# Patient Record
Sex: Female | Born: 1960 | Race: White | Hispanic: No | Marital: Married | Smoking: Never smoker
Health system: Southern US, Community
[De-identification: ages and names within clinical notes are randomized; demographics above are authoritative.]

## PROBLEM LIST (undated history)

## (undated) DIAGNOSIS — M8580 Other specified disorders of bone density and structure, unspecified site: Secondary | ICD-10-CM

## (undated) DIAGNOSIS — F509 Eating disorder, unspecified: Secondary | ICD-10-CM

## (undated) DIAGNOSIS — E785 Hyperlipidemia, unspecified: Secondary | ICD-10-CM

## (undated) DIAGNOSIS — N390 Urinary tract infection, site not specified: Secondary | ICD-10-CM

## (undated) DIAGNOSIS — F909 Attention-deficit hyperactivity disorder, unspecified type: Secondary | ICD-10-CM

## (undated) HISTORY — DX: Attention-deficit hyperactivity disorder, unspecified type: F90.9

## (undated) HISTORY — DX: Other specified disorders of bone density and structure, unspecified site: M85.80

## (undated) HISTORY — DX: Eating disorder, unspecified: F50.9

## (undated) HISTORY — DX: Hyperlipidemia, unspecified: E78.5

## (undated) HISTORY — DX: Urinary tract infection, site not specified: N39.0

---

## 1998-07-28 ENCOUNTER — Other Ambulatory Visit: Admission: RE | Admit: 1998-07-28 | Discharge: 1998-07-28 | Payer: Self-pay | Admitting: Obstetrics & Gynecology

## 2000-01-13 ENCOUNTER — Other Ambulatory Visit: Admission: RE | Admit: 2000-01-13 | Discharge: 2000-01-13 | Payer: Self-pay | Admitting: Obstetrics & Gynecology

## 2001-04-10 ENCOUNTER — Other Ambulatory Visit: Admission: RE | Admit: 2001-04-10 | Discharge: 2001-04-10 | Payer: Self-pay | Admitting: Obstetrics and Gynecology

## 2006-04-07 ENCOUNTER — Encounter: Admission: RE | Admit: 2006-04-07 | Discharge: 2006-04-07 | Payer: Self-pay | Admitting: Obstetrics and Gynecology

## 2012-12-26 ENCOUNTER — Other Ambulatory Visit: Payer: Self-pay

## 2012-12-26 DIAGNOSIS — Z1231 Encounter for screening mammogram for malignant neoplasm of breast: Secondary | ICD-10-CM

## 2013-01-02 ENCOUNTER — Ambulatory Visit
Admission: RE | Admit: 2013-01-02 | Discharge: 2013-01-02 | Disposition: A | Discharge status: Home / Self Care | Payer: BC Managed Care – PPO | Source: Ambulatory Visit

## 2013-01-02 DIAGNOSIS — Z1231 Encounter for screening mammogram for malignant neoplasm of breast: Secondary | ICD-10-CM

## 2016-12-15 DIAGNOSIS — Z131 Encounter for screening for diabetes mellitus: Secondary | ICD-10-CM | POA: Diagnosis not present

## 2016-12-15 DIAGNOSIS — E7800 Pure hypercholesterolemia, unspecified: Secondary | ICD-10-CM | POA: Diagnosis not present

## 2016-12-15 DIAGNOSIS — Z23 Encounter for immunization: Secondary | ICD-10-CM | POA: Diagnosis not present

## 2017-02-07 DIAGNOSIS — Z1231 Encounter for screening mammogram for malignant neoplasm of breast: Secondary | ICD-10-CM | POA: Diagnosis not present

## 2017-02-07 DIAGNOSIS — Z6822 Body mass index (BMI) 22.0-22.9, adult: Secondary | ICD-10-CM | POA: Diagnosis not present

## 2017-02-07 DIAGNOSIS — Z01419 Encounter for gynecological examination (general) (routine) without abnormal findings: Secondary | ICD-10-CM | POA: Diagnosis not present

## 2018-01-05 DIAGNOSIS — Z23 Encounter for immunization: Secondary | ICD-10-CM | POA: Diagnosis not present

## 2018-01-05 DIAGNOSIS — N390 Urinary tract infection, site not specified: Secondary | ICD-10-CM | POA: Diagnosis not present

## 2018-01-05 DIAGNOSIS — E7800 Pure hypercholesterolemia, unspecified: Secondary | ICD-10-CM | POA: Diagnosis not present

## 2018-02-08 DIAGNOSIS — Z6822 Body mass index (BMI) 22.0-22.9, adult: Secondary | ICD-10-CM | POA: Diagnosis not present

## 2018-02-08 DIAGNOSIS — R399 Unspecified symptoms and signs involving the genitourinary system: Secondary | ICD-10-CM | POA: Diagnosis not present

## 2018-02-08 DIAGNOSIS — Z124 Encounter for screening for malignant neoplasm of cervix: Secondary | ICD-10-CM | POA: Diagnosis not present

## 2018-02-08 DIAGNOSIS — Z01419 Encounter for gynecological examination (general) (routine) without abnormal findings: Secondary | ICD-10-CM | POA: Diagnosis not present

## 2018-03-21 ENCOUNTER — Ambulatory Visit (HOSPITAL_COMMUNITY)
Admission: EM | Admit: 2018-03-21 | Discharge: 2018-03-21 | Disposition: A | Discharge status: Home / Self Care | Payer: 59 | Attending: Emergency Medicine | Admitting: Emergency Medicine

## 2018-03-21 ENCOUNTER — Encounter (HOSPITAL_COMMUNITY): Payer: Self-pay

## 2018-03-21 DIAGNOSIS — R059 Cough, unspecified: Secondary | ICD-10-CM

## 2018-03-21 DIAGNOSIS — R05 Cough: Secondary | ICD-10-CM | POA: Diagnosis not present

## 2018-03-21 DIAGNOSIS — J22 Unspecified acute lower respiratory infection: Secondary | ICD-10-CM | POA: Diagnosis not present

## 2018-03-21 MED ORDER — AZITHROMYCIN 250 MG PO TABS
250.0000 mg | ORAL_TABLET | Freq: Every day | ORAL | 0 refills | Status: DC
Start: 2018-03-21 — End: 2020-02-11

## 2018-03-21 MED ORDER — HYDROCODONE-HOMATROPINE 5-1.5 MG/5ML PO SYRP: 5.0000 mL | ORAL_SOLUTION | Freq: Four times a day (QID) | ORAL | 0 refills | Status: DC | PRN

## 2018-03-21 MED ORDER — BENZONATATE 200 MG PO CAPS: 200.0000 mg | ORAL_CAPSULE | Freq: Three times a day (TID) | ORAL | 0 refills | Status: DC

## 2018-03-21 NOTE — ED Triage Notes (Signed)
Pt presents a cough that she has had for over a week.

## 2018-03-21 NOTE — Discharge Instructions (Signed)
Please begin taking azithromycin-2 tablets today, 1 tablet for the following 4 days to cover any  atypical respiratory infection May use Tessalon every 8 hours as needed during the day or at work May use Hycodan cough syrup at bedtime or when resting at home.  This will cause drowsiness, do not drive or work after taking. Please consider also starting a daily allergy pill to help with any congestion and drainage contributing to cough-may get generic of Zyrtec or Claritin over-the-counter  Please follow-up if symptoms continuing to not improve, worsening, developing fever, chest discomfort, shortness of breath

## 2018-03-21 NOTE — ED Provider Notes (Signed)
[facility]    CSN: [id number] Arrival date & time: 03/21/18  1744     History   Chief Complaint Chief Complaint  Patient presents with  . Cough    HPI Lynsie C [place] is a 57 y.o. female no significant past medical history presenting today for evaluation of a cough.  Patient has had a cough for over 1 week.  She initially had sore throat and congestion, but this is relatively improved.  Cough is been occasionally productive.  Overall she does not feel too bad, but the cough is been interrupting work as well as sleep.  Denies history of asthma or smoking.  She has tried over-the-counter cough medicine without relief.  Denies fevers.  Denies associated nausea, vomiting or diarrhea.  HPI  History reviewed. No pertinent past medical history.  There are no active problems to display for this patient.   History reviewed. No pertinent surgical history.  OB History   None      Home Medications    Prior to Admission medications   Medication Sig Start Date End Date Taking? Authorizing Provider  azithromycin (ZITHROMAX) 250 MG tablet Take 1 tablet (250 mg total) by mouth daily. Take first 2 tablets together, then 1 every day until finished. 03/21/18   Patirica Longshore C, PA-C  benzonatate (TESSALON) 200 MG capsule Take 1 capsule (200 mg total) by mouth every 8 (eight) hours. Use during day/work 03/21/18   Nakiah Osgood C, PA-C  HYDROcodone-homatropine (HYCODAN) 5-1.5 MG/5ML syrup Take 5 mLs by mouth every 6 (six) hours as needed for cough. Limit use to at home or bedtime. Do not drive or work after taking. 03/21/18   Treyvonne Tata, Junius Creamer, PA-C    Family History Family History  Family history unknown: Yes    Social History Social History   Tobacco Use  . Smoking status: Never Smoker  . Smokeless tobacco: Never Used  Substance Use Topics  . Alcohol use: Never    Frequency: Never  . Drug use: Never     Allergies   Patient has no allergy information on  record.   Review of Systems Review of Systems  Constitutional: Negative for activity change, appetite change, chills, fatigue and fever.  HENT: Positive for congestion and sore throat. Negative for ear pain, rhinorrhea, sinus pressure and trouble swallowing.   Eyes: Negative for discharge and redness.  Respiratory: Positive for cough. Negative for chest tightness and shortness of breath.   Cardiovascular: Negative for chest pain.  Gastrointestinal: Negative for abdominal pain, diarrhea, nausea and vomiting.  Musculoskeletal: Negative for myalgias.  Skin: Negative for rash.  Neurological: Negative for dizziness, light-headedness and headaches.     Physical Exam Triage Vital Signs ED Triage Vitals  Enc Vitals Group     BP 03/21/18 1818 113/69     Pulse Rate 03/21/18 1818 91     Resp 03/21/18 1818 16     Temp 03/21/18 1818 98.3 F (36.8 C)     Temp Source 03/21/18 1818 Oral     SpO2 03/21/18 1818 100 %     Weight --      Height --      Head Circumference --      Peak Flow --      Pain Score 03/21/18 1817 0     Pain Loc --      Pain Edu? --      Excl. in GC? --    No data found.  Updated Vital Signs BP 113/69 (  BP Location: Right Arm)   Pulse 91   Temp 98.3 F (36.8 C) (Oral)   Resp 16   SpO2 100%   Visual Acuity Right Eye Distance:   Left Eye Distance:   Bilateral Distance:    Right Eye Near:   Left Eye Near:    Bilateral Near:     Physical Exam  Constitutional: She appears well-developed and well-nourished. No distress.  HENT:  Head: Normocephalic and atraumatic.  Bilateral ears without tenderness to palpation of external auricle, tragus and mastoid, EAC's without erythema or swelling, TM's with good bony landmarks and cone of light. Non erythematous.  Oral mucosa pink and moist, no tonsillar enlargement or exudate. Posterior pharynx patent and nonerythematous, no uvula deviation or swelling. Normal phonation.  Eyes: Conjunctivae are normal.  Neck: Neck  supple.  Cardiovascular: Normal rate and regular rhythm.  No murmur heard. Pulmonary/Chest: Effort normal and breath sounds normal. No respiratory distress.  Breathing comfortably at rest, CTABL, no wheezing, rales or other adventitious sounds auscultated  Abdominal: Soft. There is no tenderness.  Musculoskeletal: She exhibits no edema.  Neurological: She is alert.  Skin: Skin is warm and dry.  Psychiatric: She has a normal mood and affect.  Nursing note and vitals reviewed.    UC Treatments / Results  Labs (all labs ordered are listed, but only abnormal results are displayed) Labs Reviewed - No data to display  EKG None  Radiology No results found.  Procedures Procedures (including critical care time)  Medications Ordered in UC Medications - No data to display  Initial Impression / Assessment and Plan / UC Course  I have reviewed the triage vital signs and the nursing notes.  Pertinent labs & imaging results that were available during my care of the patient were reviewed by me and considered in my medical decision making (see chart for details).     Patient with URI symptoms for greater than 1 week.  Will provide azithromycin to cover atypical respiratory illnesses does not seem largely in chest.  Will provide Tessalon for cough during the day, Hycodan to use at home at bedtime.  Also recommended daily allergy pill to help with any drainage that could be triggering cough.  Continue to monitor symptoms, breathing and temperature,Discussed strict return precautions. Patient verbalized understanding and is agreeable with plan.  Final Clinical Impressions(s) / UC Diagnoses   Final diagnoses:  Cough  Lower respiratory infection (e.g., bronchitis, pneumonia, pneumonitis, pulmonitis)     Discharge Instructions     Please begin taking azithromycin-2 tablets today, 1 tablet for the following 4 days to cover any  atypical respiratory infection May use Tessalon every 8 hours  as needed during the day or at work May use Hycodan cough syrup at bedtime or when resting at home.  This will cause drowsiness, do not drive or work after taking. Please consider also starting a daily allergy pill to help with any congestion and drainage contributing to cough-may get generic of Zyrtec or Claritin over-the-counter  Please follow-up if symptoms continuing to not improve, worsening, developing fever, chest discomfort, shortness of breath   ED Prescriptions    Medication Sig Dispense Auth. Provider   azithromycin (ZITHROMAX) 250 MG tablet Take 1 tablet (250 mg total) by mouth daily. Take first 2 tablets together, then 1 every day until finished. 6 tablet Reighlynn Swiney C, PA-C   HYDROcodone-homatropine (HYCODAN) 5-1.5 MG/5ML syrup Take 5 mLs by mouth every 6 (six) hours as needed for cough. Limit use to  at home or bedtime. Do not drive or work after taking. 75 mL Qasim Diveley C, PA-C   benzonatate (TESSALON) 200 MG capsule Take 1 capsule (200 mg total) by mouth every 8 (eight) hours. Use during day/work 20 capsule Ahmira Boisselle, Harts C, PA-C     Controlled Substance Prescriptions [place] Controlled Substance Registry consulted? Not Applicable   Lew Dawes, [place] 03/21/18 1953

## 2018-03-28 ENCOUNTER — Telehealth: Payer: Self-pay | Admitting: Physician Assistant

## 2018-03-28 NOTE — Telephone Encounter (Signed)
Patient requesting Adderall prescription per conversation with you. Please send to [facility] on [address]. Chart in your box.

## 2018-03-30 ENCOUNTER — Other Ambulatory Visit: Payer: Self-pay | Admitting: Physician Assistant

## 2018-03-30 MED ORDER — AMPHETAMINE-DEXTROAMPHETAMINE 7.5 MG PO TABS: 7.5000 mg | ORAL_TABLET | Freq: Every day | ORAL | 0 refills | Status: DC

## 2018-05-03 ENCOUNTER — Encounter: Payer: Self-pay | Admitting: Emergency Medicine

## 2018-05-03 DIAGNOSIS — F988 Other specified behavioral and emotional disorders with onset usually occurring in childhood and adolescence: Secondary | ICD-10-CM

## 2018-06-23 ENCOUNTER — Ambulatory Visit: Payer: 59 | Admitting: Physician Assistant

## 2018-06-23 ENCOUNTER — Encounter: Payer: Self-pay | Admitting: Physician Assistant

## 2018-06-23 ENCOUNTER — Other Ambulatory Visit: Payer: Self-pay

## 2018-06-23 DIAGNOSIS — F900 Attention-deficit hyperactivity disorder, predominantly inattentive type: Secondary | ICD-10-CM | POA: Diagnosis not present

## 2018-06-23 DIAGNOSIS — F4010 Social phobia, unspecified: Secondary | ICD-10-CM | POA: Diagnosis not present

## 2018-06-23 MED ORDER — AMPHETAMINE-DEXTROAMPHETAMINE 7.5 MG PO TABS: 7.5000 mg | ORAL_TABLET | Freq: Every day | ORAL | 0 refills | Status: DC

## 2018-06-23 NOTE — Progress Notes (Signed)
[facility]  Patient ID: Misty Osborne,  MRN: [id number]  PCP: Shaune Pollack, MD (Inactive)  Date of Evaluation: 06/23/2018 Time spent:15 minutes  Chief Complaint:  Chief Complaint    Follow-up      HISTORY/CURRENT STATUS: HPI here for routine six-month med check.  States she is doing well.  The Adderall works the vast majority of the time.  There can be days that she does not feel like she is taking it at all but she is not sure if it is her work load is overwhelming or what. States that attention is good  Able to focus on things and finish tasks to completion.  She does get a little nervous just because this is a controlled substance.  She feels like when she goes to the pharmacy that she is being judged for taking it.  "I think it is just all in my head."  She continues to take the propranolol before a big presentation or something at work.  It still works for that anxiety but it does cause fatigue sometimes.  Denies muscle or joint pain, stiffness, or dystonia.  Denies dizziness, syncope, seizures, numbness, tingling, tremor, tics, unsteady gait, slurred speech, confusion.   Individual Medical History/ Review of Systems: Changes? :No    Past medications for mental health diagnoses include: Prozac, Adderall, propranolol  Allergies: Patient has no known allergies.  Current Medications:  Current Outpatient Medications:  .  [START ON 07/04/2018] amphetamine-dextroamphetamine (ADDERALL) 7.5 MG tablet, Take 1 tablet by mouth daily., Disp: 30 tablet, Rfl: 0 .  propranolol (INDERAL) 20 MG tablet, Take 20 mg by mouth daily as needed., Disp: , Rfl: 5 .  [START ON 08/03/2018] amphetamine-dextroamphetamine (ADDERALL) 7.5 MG tablet, Take 1 tablet by mouth daily., Disp: 30 tablet, Rfl: 0 .  [START ON 09/01/2018] amphetamine-dextroamphetamine (ADDERALL) 7.5 MG tablet, Take 1 tablet by mouth daily., Disp: 30 tablet, Rfl: 0 .  azithromycin (ZITHROMAX) 250 MG tablet, Take 1 tablet  (250 mg total) by mouth daily. Take first 2 tablets together, then 1 every day until finished. (Patient not taking: Reported on 06/23/2018), Disp: 6 tablet, Rfl: 0 .  benzonatate (TESSALON) 200 MG capsule, Take 1 capsule (200 mg total) by mouth every 8 (eight) hours. Use during day/work (Patient not taking: Reported on 06/23/2018), Disp: 20 capsule, Rfl: 0 .  HYDROcodone-homatropine (HYCODAN) 5-1.5 MG/5ML syrup, Take 5 mLs by mouth every 6 (six) hours as needed for cough. Limit use to at home or bedtime. Do not drive or work after taking. (Patient not taking: Reported on 06/23/2018), Disp: 75 mL, Rfl: 0 Medication Side Effects: none  Family Medical/ Social History: Changes? No  MENTAL HEALTH EXAM:  There were no vitals taken for this visit.There is no height or weight on file to calculate BMI.  General Appearance: Casual and Well Groomed  Eye Contact:  Good  Speech:  Clear and Coherent  Volume:  Normal  Mood:  Euthymic  Affect:  Appropriate  Thought Process:  Goal Directed  Orientation:  Full (Time, Place, and Person)  Thought Content: Logical   Suicidal Thoughts:  No  Homicidal Thoughts:  No  Memory:  WNL  Judgement:  Good  Insight:  Good  Psychomotor Activity:  Normal  Concentration:  Concentration: Good and Attention Span: Good  Recall:  Good  Fund of Knowledge: Good  Language: Good  Assets:  Desire for Improvement  ADL's:  Intact  Cognition: WNL  Prognosis:  Good    DIAGNOSES:    ICD-10-CM  1. Attention deficit hyperactivity disorder (ADHD), predominantly inattentive type F90.0   2. Social anxiety disorder F40.10     Receiving Psychotherapy: No    RECOMMENDATIONS: We discussed the fact that the Adderall is working so I prefer not to make any changes.  She is on a low dose and has responded to that same dose for almost 2 years now.  If we did change to a non-stimulant, I would recommend Strattera.  We discussed briefly.  She will think about it but at this point would  like to "not rock the boat." Continue Adderall 7.5 mg p.o. every morning.  PDMP was reviewed. Continue propranolol as needed public speaking.  Her PCP gives her that. Return in 6 months or sooner as needed.  [name], PA-C   This record has been created using [employer].  Chart creation errors have been sought, but may not always have been located and corrected. Such creation errors do not reflect on the standard of medical care.

## 2018-09-07 ENCOUNTER — Other Ambulatory Visit: Payer: Self-pay

## 2018-09-07 ENCOUNTER — Ambulatory Visit (HOSPITAL_COMMUNITY)
Admission: EM | Admit: 2018-09-07 | Discharge: 2018-09-07 | Disposition: A | Discharge status: Home / Self Care | Payer: No Typology Code available for payment source | Attending: Family Medicine | Admitting: Family Medicine

## 2018-09-07 ENCOUNTER — Encounter (HOSPITAL_COMMUNITY): Payer: Self-pay

## 2018-09-07 DIAGNOSIS — N3090 Cystitis, unspecified without hematuria: Secondary | ICD-10-CM

## 2018-09-07 MED ORDER — CEPHALEXIN 500 MG PO CAPS: 500.0000 mg | ORAL_CAPSULE | Freq: Two times a day (BID) | ORAL | 0 refills | Status: DC

## 2018-09-07 NOTE — ED Triage Notes (Signed)
Pt states she has had a UTI for 3 days.

## 2018-09-07 NOTE — ED Provider Notes (Signed)
[facility]    ASSESSMENT & PLAN:  1. Cystitis     Meds ordered this encounter  Medications  . cephALEXin (KEFLEX) 500 MG capsule    Sig: Take 1 capsule (500 mg total) by mouth 2 (two) times daily.    Dispense:  10 capsule    Refill:  0   Visual U/A interpretation with moderate leukocytes (U/A machine down). Urine culture sent. No signs of pyelonephritis. Will follow up with her PCP or here if not showing improvement over the next 48 hours, sooner if needed.  Outlined signs and symptoms indicating need for more acute intervention. Patient verbalized understanding. After Visit Summary given.  SUBJECTIVE:  Misty Osborne [place] is a 58 y.o. female who complains of urinary frequency, urgency for the past 2-3 days. Without associated flank pain, fever, chills, genitourinary discharge or gross bleeding. Hematuria: not present. Normal PO intake. Without specific abdominal pain. No self treatment reported. Ambulatory without difficulty.  LMP: No LMP recorded. Patient is postmenopausal.  ROS: As in HPI.  OBJECTIVE:  Vitals:   09/07/18 1154 09/07/18 1155  BP: 104/65   Pulse: 72   Resp: 16   Temp: 98.1 F (36.7 C)   TempSrc: Oral   SpO2: 100%   Weight:  59 kg   General appearance: alert; no distress HENT: oropharynx: moist Lungs: unlabored respirations Abdomen: soft Extremities: no edema Skin: warm and dry Neurologic: normal gait Psychological: alert and cooperative; normal mood and affect  Labs Reviewed  URINE CULTURE    No Known Allergies  PMH: H/O UTI.  Social History   Socioeconomic History  . Marital status: Married    Spouse name: Not on file  . Number of children: Not on file  . Years of education: Not on file  . Highest education level: Not on file  Occupational History  . Not on file  Social Needs  . Financial resource strain: Not on file  . Food insecurity:    Worry: Not on file    Inability: Not on file  . Transportation needs:   Medical: Not on file    Non-medical: Not on file  Tobacco Use  . Smoking status: Never Smoker  . Smokeless tobacco: Never Used  Substance and Sexual Activity  . Alcohol use: Yes    Alcohol/week: 2.0 standard drinks    Types: 2 Glasses of wine per week    Frequency: Never  . Drug use: Never  . Sexual activity: Not on file  Lifestyle  . Physical activity:    Days per week: Not on file    Minutes per session: Not on file  . Stress: Not on file  Relationships  . Social connections:    Talks on phone: Not on file    Gets together: Not on file    Attends religious service: Not on file    Active member of club or organization: Not on file    Attends meetings of clubs or organizations: Not on file    Relationship status: Not on file  . Intimate partner violence:    Fear of current or ex partner: Not on file    Emotionally abused: Not on file    Physically abused: Not on file    Forced sexual activity: Not on file  Other Topics Concern  . Not on file  Social History Narrative  . Not on file   Family History  Family history unknown: Yes       Mardella Layman, MD 09/07/18 1207

## 2018-09-08 LAB — URINE CULTURE: Culture: 10000 — AB

## 2018-10-09 ENCOUNTER — Other Ambulatory Visit: Payer: Self-pay

## 2018-10-09 ENCOUNTER — Telehealth: Payer: Self-pay | Admitting: Physician Assistant

## 2018-10-09 NOTE — Telephone Encounter (Signed)
Pended for approval.

## 2018-10-09 NOTE — Telephone Encounter (Signed)
Needs RF of Adderall for next three months. Please send to [facility], [address]

## 2018-10-10 MED ORDER — AMPHETAMINE-DEXTROAMPHETAMINE 7.5 MG PO TABS: 7.5000 mg | ORAL_TABLET | Freq: Every day | ORAL | 0 refills | Status: DC

## 2018-12-26 ENCOUNTER — Ambulatory Visit: Payer: 59 | Admitting: Physician Assistant

## 2019-01-11 ENCOUNTER — Other Ambulatory Visit: Payer: Self-pay

## 2019-01-11 ENCOUNTER — Telehealth: Payer: Self-pay | Admitting: Physician Assistant

## 2019-01-11 NOTE — Telephone Encounter (Signed)
Pt has appt 10/15. Rx for Adderall 7.5 mg will run out Monday. Requesting refill @ [facility] on file

## 2019-01-11 NOTE — Telephone Encounter (Signed)
Last refill 12/11/2018 Pended for approval

## 2019-01-12 MED ORDER — AMPHETAMINE-DEXTROAMPHETAMINE 7.5 MG PO TABS: 7.5000 mg | ORAL_TABLET | Freq: Every day | ORAL | 0 refills | Status: DC

## 2019-01-17 ENCOUNTER — Telehealth: Payer: Self-pay | Admitting: Physician Assistant

## 2019-01-17 NOTE — Telephone Encounter (Signed)
Patient left a message stating that the pharmacy did not receive our script of her adderall. Please res[facility] to the [facility] [address]

## 2019-01-17 NOTE — Telephone Encounter (Signed)
Spoke to pharmacist and he says it's filled and ready for her to pick up

## 2019-01-25 ENCOUNTER — Ambulatory Visit: Payer: 59 | Admitting: Physician Assistant

## 2019-02-12 ENCOUNTER — Other Ambulatory Visit: Payer: Self-pay

## 2019-02-12 ENCOUNTER — Telehealth: Payer: Self-pay | Admitting: Physician Assistant

## 2019-02-12 MED ORDER — AMPHETAMINE-DEXTROAMPHETAMINE 7.5 MG PO TABS: 7.5000 mg | ORAL_TABLET | Freq: Every day | ORAL | 0 refills | Status: DC

## 2019-02-12 NOTE — Telephone Encounter (Signed)
Last refill 10/07 pended for approval

## 2019-02-12 NOTE — Telephone Encounter (Signed)
Please RF the Adderall rx for her. Will do appt Nov 10. Wants the pharmacy changed b/c she moved- [facility] [address] in [place]. Update her pharmacy for that [facility]

## 2019-02-20 ENCOUNTER — Ambulatory Visit (INDEPENDENT_AMBULATORY_CARE_PROVIDER_SITE_OTHER): Payer: No Typology Code available for payment source | Admitting: Physician Assistant

## 2019-02-20 ENCOUNTER — Other Ambulatory Visit: Payer: Self-pay

## 2019-02-20 ENCOUNTER — Encounter: Payer: Self-pay | Admitting: Physician Assistant

## 2019-02-20 DIAGNOSIS — F900 Attention-deficit hyperactivity disorder, predominantly inattentive type: Secondary | ICD-10-CM | POA: Diagnosis not present

## 2019-02-20 DIAGNOSIS — F4010 Social phobia, unspecified: Secondary | ICD-10-CM

## 2019-02-20 MED ORDER — AMPHETAMINE-DEXTROAMPHETAMINE 7.5 MG PO TABS: 7.5000 mg | ORAL_TABLET | Freq: Every day | ORAL | 0 refills | Status: DC

## 2019-02-20 NOTE — Progress Notes (Signed)
[facility]  Patient ID: Misty Osborne,  MRN: [id number]  PCP: Shaune Pollack, MD (Inactive)  Date of Evaluation: 02/20/2019 Time spent:15 minutes  Chief Complaint:  Chief Complaint    ADD; Follow-up     Virtual Visit via Telephone Note  I connected with patient by a video enabled telemedicine application or telephone, with their informed consent, and verified patient privacy and that I am speaking with the correct person using two identifiers.  I am private, in my office and the patient is home.   I discussed the limitations, risks, security and privacy concerns of performing an evaluation and management service by telephone and the availability of in person appointments. I also discussed with the patient that there may be a patient responsible charge related to this service. The patient expressed understanding and agreed to proceed.   I discussed the assessment and treatment plan with the patient. The patient was provided an opportunity to ask questions and all were answered. The patient agreed with the plan and demonstrated an understanding of the instructions.   The patient was advised to call back or seek an in-person evaluation if the symptoms worsen or if the condition fails to improve as anticipated.  I provided 15 minutes of non-face-to-face time during this encounter.  HISTORY/CURRENT STATUS: HPI For routine six-month med check.  States she is doing well.  The Adderall works the vast majority of the time.  Doesn't take it everyday.  States that attention is good  Able to focus on things and finish tasks to completion.    She continues to take the propranolol before a big presentation or something at work.  It still works for that anxiety but it does cause fatigue sometimes. PCP gives this to her.  Uses it only around once a week or so.  Patient denies loss of interest in usual activities and is able to enjoy things.  Denies decreased energy or motivation.   Appetite has not changed.  No extreme sadness, tearfulness, or feelings of hopelessness.  Denies any changes in concentration, making decisions or remembering things.  Denies suicidal or homicidal thoughts.   Denies muscle or joint pain, stiffness, or dystonia. Denies dizziness, syncope, seizures, numbness, tingling, tremor, tics, unsteady gait, slurred speech, confusion.   Individual Medical History/ Review of Systems: Changes? :No    Past medications for mental health diagnoses include: Prozac, Adderall, propranolol  Allergies: Patient has no known allergies.  Current Medications:  Current Outpatient Medications:  .  [START ON 04/20/2019] amphetamine-dextroamphetamine (ADDERALL) 7.5 MG tablet, Take 1 tablet by mouth daily., Disp: 30 tablet, Rfl: 0 .  [START ON 03/21/2019] amphetamine-dextroamphetamine (ADDERALL) 7.5 MG tablet, Take 1 tablet by mouth daily., Disp: 30 tablet, Rfl: 0 .  amphetamine-dextroamphetamine (ADDERALL) 7.5 MG tablet, Take 1 tablet by mouth daily., Disp: 30 tablet, Rfl: 0 .  pravastatin (PRAVACHOL) 10 MG tablet, Take 10 mg by mouth daily., Disp: , Rfl:  .  propranolol (INDERAL) 20 MG tablet, Take 20 mg by mouth daily as needed., Disp: , Rfl: 5 .  azithromycin (ZITHROMAX) 250 MG tablet, Take 1 tablet (250 mg total) by mouth daily. Take first 2 tablets together, then 1 every day until finished. (Patient not taking: Reported on 06/23/2018), Disp: 6 tablet, Rfl: 0 .  benzonatate (TESSALON) 200 MG capsule, Take 1 capsule (200 mg total) by mouth every 8 (eight) hours. Use during day/work (Patient not taking: Reported on 06/23/2018), Disp: 20 capsule, Rfl: 0 .  cephALEXin (KEFLEX) 500 MG capsule,  Take 1 capsule (500 mg total) by mouth 2 (two) times daily. (Patient not taking: Reported on 02/20/2019), Disp: 10 capsule, Rfl: 0 .  HYDROcodone-homatropine (HYCODAN) 5-1.5 MG/5ML syrup, Take 5 mLs by mouth every 6 (six) hours as needed for cough. Limit use to at home or bedtime. Do not  drive or work after taking. (Patient not taking: Reported on 06/23/2018), Disp: 75 mL, Rfl: 0 Medication Side Effects: none  Family Medical/ Social History: Changes?  Her daughter is expecting, baby girl is due in March.  It is her first grandchild. Patient moved to [place], [place].  They are planning to build a house.  MENTAL HEALTH EXAM:  There were no vitals taken for this visit.There is no height or weight on file to calculate BMI.  General Appearance: Unable to assess  Eye Contact:  Unable to assess  Speech:  Clear and Coherent  Volume:  Normal  Mood:  Euthymic  Affect:  Unable to assess  Thought Process:  Goal Directed and Descriptions of Associations: Intact  Orientation:  Full (Time, Place, and Person)  Thought Content: Logical   Suicidal Thoughts:  No  Homicidal Thoughts:  No  Memory:  WNL  Judgement:  Good  Insight:  Good  Psychomotor Activity:  Unable to assess  Concentration:  Concentration: Good and Attention Span: Good  Recall:  Good  Fund of Knowledge: Good  Language: Good  Assets:  Desire for Improvement  ADL's:  Intact  Cognition: WNL  Prognosis:  Good    DIAGNOSES:    ICD-10-CM   1. Attention deficit hyperactivity disorder (ADHD), predominantly inattentive type  F90.0   2. Social anxiety disorder  F40.10     Receiving Psychotherapy: No    RECOMMENDATIONS:  I am glad she continues to do well! Continue Adderall 7.5 mg p.o. every morning.  PDMP was reviewed. Continue propranolol as needed public speaking. Rx per PCP.  Return in 6 months.  Donnal Moat, PA-C

## 2019-02-23 ENCOUNTER — Other Ambulatory Visit: Payer: Self-pay

## 2019-02-23 ENCOUNTER — Telehealth: Payer: Self-pay | Admitting: Physician Assistant

## 2019-02-23 MED ORDER — AMPHETAMINE-DEXTROAMPHETAMINE 7.5 MG PO TABS: 7.5000 mg | ORAL_TABLET | Freq: Every day | ORAL | 0 refills | Status: DC

## 2019-02-23 NOTE — Telephone Encounter (Signed)
[place] called to report that the [facility] in [place] doesn't have any Adderall. Please [facility] [address] in [place] because they have it.  ONLY this month.  [facility] said they would order it and have it for the refills.

## 2019-06-19 ENCOUNTER — Other Ambulatory Visit: Payer: Self-pay

## 2019-06-19 ENCOUNTER — Telehealth: Payer: Self-pay | Admitting: Physician Assistant

## 2019-06-19 MED ORDER — AMPHETAMINE-DEXTROAMPHETAMINE 7.5 MG PO TABS: 7.5000 mg | ORAL_TABLET | Freq: Every day | ORAL | 0 refills | Status: DC

## 2019-06-19 NOTE — Telephone Encounter (Signed)
Last refill 05/25/2019 Pended 3 RX's for Misty Osborne to submit Due back in June 2021

## 2019-06-19 NOTE — Telephone Encounter (Signed)
Pt is requesting her next set of Adderall scripts to go to [facility] in [place] on file.  She will call to schedule in June.

## 2019-09-24 ENCOUNTER — Other Ambulatory Visit: Payer: Self-pay | Admitting: Physician Assistant

## 2019-09-24 ENCOUNTER — Telehealth: Payer: Self-pay | Admitting: Physician Assistant

## 2019-09-24 MED ORDER — AMPHETAMINE-DEXTROAMPHETAMINE 7.5 MG PO TABS: 7.5000 mg | ORAL_TABLET | Freq: Every day | ORAL | 0 refills | Status: DC

## 2019-09-24 NOTE — Telephone Encounter (Signed)
Rx sent 

## 2019-09-24 NOTE — Telephone Encounter (Signed)
Patient called and said that she needs a refill on her adderall 7.5 mg to [facility] [address] in [place]. She has an appt for 7/1

## 2019-10-11 ENCOUNTER — Ambulatory Visit (INDEPENDENT_AMBULATORY_CARE_PROVIDER_SITE_OTHER): Payer: No Typology Code available for payment source | Admitting: Physician Assistant

## 2019-10-11 ENCOUNTER — Other Ambulatory Visit: Payer: Self-pay

## 2019-10-11 ENCOUNTER — Encounter: Payer: Self-pay | Admitting: Physician Assistant

## 2019-10-11 VITALS — BP 112/58 | HR 73

## 2019-10-11 DIAGNOSIS — F900 Attention-deficit hyperactivity disorder, predominantly inattentive type: Secondary | ICD-10-CM | POA: Diagnosis not present

## 2019-10-11 DIAGNOSIS — F4010 Social phobia, unspecified: Secondary | ICD-10-CM

## 2019-10-11 MED ORDER — AMPHETAMINE-DEXTROAMPHETAMINE 10 MG PO TABS: 10.0000 mg | ORAL_TABLET | Freq: Every day | ORAL | 0 refills | Status: DC

## 2019-10-11 NOTE — Progress Notes (Signed)
[facility]  Patient ID: Misty Osborne,  MRN: [id number]  PCP: Shaune Pollack, MD (Inactive)  Date of Evaluation: 10/11/2019 Time spent:20 minutes  Chief Complaint:  Chief Complaint    ADD; Follow-up      HISTORY/CURRENT STATUS: HPI For routine six-month med check.  Feels that the Adderall is not working as well as it did.  There has been a few times when she cannot even remember if she took it or not.  She does not really want to increase the dose unless she has to.  She continues to take the propranolol before a big presentation or something at work.  It still works for that anxiety but it does cause fatigue sometimes. PCP gives this to her.  Uses it only around once a week or so.  Patient denies loss of interest in usual activities and is able to enjoy things.  Denies decreased energy or motivation.  Appetite has not changed.  No extreme sadness, tearfulness, or feelings of hopelessness.  Denies any changes in concentration, making decisions or remembering things.  Denies suicidal or homicidal thoughts.   Denies muscle or joint pain, stiffness, or dystonia. Denies dizziness, syncope, seizures, numbness, tingling, tremor, tics, unsteady gait, slurred speech, confusion.   Individual Medical History/ Review of Systems: Changes? :No    Past medications for mental health diagnoses include: Prozac, Adderall, propranolol  Allergies: Patient has no known allergies.  Current Medications:  Current Outpatient Medications:    pravastatin (PRAVACHOL) 10 MG tablet, Take 10 mg by mouth daily., Disp: , Rfl:    propranolol (INDERAL) 20 MG tablet, Take 20 mg by mouth daily as needed., Disp: , Rfl: 5   amphetamine-dextroamphetamine (ADDERALL) 10 MG tablet, Take 1 tablet (10 mg total) by mouth daily with breakfast., Disp: 30 tablet, Rfl: 0   azithromycin (ZITHROMAX) 250 MG tablet, Take 1 tablet (250 mg total) by mouth daily. Take first 2 tablets together, then 1 every day until  finished. (Patient not taking: Reported on 06/23/2018), Disp: 6 tablet, Rfl: 0   benzonatate (TESSALON) 200 MG capsule, Take 1 capsule (200 mg total) by mouth every 8 (eight) hours. Use during day/work (Patient not taking: Reported on 06/23/2018), Disp: 20 capsule, Rfl: 0   cephALEXin (KEFLEX) 500 MG capsule, Take 1 capsule (500 mg total) by mouth 2 (two) times daily. (Patient not taking: Reported on 02/20/2019), Disp: 10 capsule, Rfl: 0   HYDROcodone-homatropine (HYCODAN) 5-1.5 MG/5ML syrup, Take 5 mLs by mouth every 6 (six) hours as needed for cough. Limit use to at home or bedtime. Do not drive or work after taking. (Patient not taking: Reported on 06/23/2018), Disp: 75 mL, Rfl: 0 Medication Side Effects: none  Family Medical/ Social History: Changes?  Became a Grandmother in March! Has a grandtr.  Her Dad died in June 03, 2022.    MENTAL HEALTH EXAM:  Blood pressure (!) 112/58, pulse 73.There is no height or weight on file to calculate BMI.  General Appearance: Casual, Neat and Well Groomed  Eye Contact:  Good  Speech:  Clear and Coherent and Normal Rate  Volume:  Normal  Mood:  Euthymic  Affect:  Appropriate  Thought Process:  Goal Directed and Descriptions of Associations: Intact  Orientation:  Full (Time, Place, and Person)  Thought Content: Logical   Suicidal Thoughts:  No  Homicidal Thoughts:  No  Memory:  WNL  Judgement:  Good  Insight:  Good  Psychomotor Activity:  Normal  Concentration:  Concentration: Fair and Attention Span: Fair  Recall:  [place] Major of Knowledge: Good  Language: Good  Assets:  Desire for Improvement  ADL's:  Intact  Cognition: WNL  Prognosis:  Good    DIAGNOSES:    ICD-10-CM   1. Attention deficit hyperactivity disorder (ADHD), predominantly inattentive type  F90.0   2. Social anxiety disorder  F40.10     Receiving Psychotherapy: No    RECOMMENDATIONS:  PDMP was reviewed. Recommend increasing Adderall.  We agreed to bump it up slightly and try  it for only 1 month.  Approximately 3 weeks then, she will call the office and let me know if it is working well or she prefers to decrease back to the current dose. Increase Adderall to 10 mg, 1 p.o. every morning. Continue propranolol as needed public speaking. Rx per PCP.  Return in 6 months.  [name], PA-C

## 2019-11-23 ENCOUNTER — Other Ambulatory Visit: Payer: Self-pay | Admitting: Physician Assistant

## 2019-11-23 ENCOUNTER — Telehealth: Payer: Self-pay | Admitting: Physician Assistant

## 2019-11-23 MED ORDER — AMPHETAMINE-DEXTROAMPHETAMINE 10 MG PO TABS: 10.0000 mg | ORAL_TABLET | Freq: Every day | ORAL | 0 refills | Status: DC

## 2019-11-23 NOTE — Telephone Encounter (Signed)
Pt called and needs a three month supply of her adderall 10 mg to be sent to the [facility] on [address] in [place]. Next appt 05/07/20

## 2019-11-23 NOTE — Telephone Encounter (Signed)
PDMP was reviewed and 3 prescriptions for Adderall were sent to the pharmacy noted above.

## 2020-02-11 ENCOUNTER — Encounter: Payer: Self-pay | Admitting: Nurse Practitioner

## 2020-02-11 ENCOUNTER — Ambulatory Visit: Payer: No Typology Code available for payment source | Admitting: Nurse Practitioner

## 2020-02-11 ENCOUNTER — Other Ambulatory Visit: Payer: Self-pay

## 2020-02-11 VITALS — BP 104/62 | HR 77 | Temp 98.3°F | Ht 63.0 in | Wt 138.0 lb

## 2020-02-11 DIAGNOSIS — M8580 Other specified disorders of bone density and structure, unspecified site: Secondary | ICD-10-CM

## 2020-02-11 DIAGNOSIS — E785 Hyperlipidemia, unspecified: Secondary | ICD-10-CM

## 2020-02-11 DIAGNOSIS — Z23 Encounter for immunization: Secondary | ICD-10-CM | POA: Diagnosis not present

## 2020-02-11 DIAGNOSIS — F988 Other specified behavioral and emotional disorders with onset usually occurring in childhood and adolescence: Secondary | ICD-10-CM

## 2020-02-11 DIAGNOSIS — Z0000 Encounter for general adult medical examination without abnormal findings: Secondary | ICD-10-CM | POA: Diagnosis not present

## 2020-02-11 DIAGNOSIS — H6121 Impacted cerumen, right ear: Secondary | ICD-10-CM

## 2020-02-11 NOTE — Patient Instructions (Addendum)
Please go to the lab today for routine physical exam.  Recommend gynecology evaluation for Pap test and mammogram if it is due this year.  Recommend checking with your GI provider regarding when your next colonoscopy is due.  Usually for family history of colon cancer colonoscopies are performed every 5 years.  For your osteopenia: Check your calcium supplements and let us know how much calcium you are taking every day.  Also check your vitamin D3 supplement and let us know how much you are taking.  Please check with your family practice group when your last tetanus vaccine was given.  That is due every 10 years.    You are also eligible for a [employer] booster.  Continue good work with diet, exercise, dental and vision regular visits.  Right ear with wax build up. Purchase Debrox over the counter and use according to directions. Please return next week for a nurse visit to get the ear irrigated.     Preventive Care 59-15 Years Old, Female Preventive care refers to visits with your health care provider and lifestyle choices that can promote health and wellness. This includes:  A yearly physical exam. This may also be called an annual well check.  Regular dental visits and eye exams.  Immunizations.  Screening for certain conditions.  Healthy lifestyle choices, such as eating a healthy diet, getting regular exercise, not using drugs or products that contain nicotine and tobacco, and limiting alcohol use. What can I expect for my preventive care visit? Physical exam Your health care provider will check your:  Height and weight. This may be used to calculate body mass index (BMI), which tells if you are at a healthy weight.  Heart rate and blood pressure.  Skin for abnormal spots. Counseling Your health care provider may ask you questions about your:  Alcohol, tobacco, and drug use.  Emotional well-being.  Home and relationship well-being.  Sexual activity.  Eating  habits.  Work and work Statistician.  Method of birth control.  Menstrual cycle.  Pregnancy history. What immunizations do I need?  Influenza (flu) vaccine  This is recommended every year. Tetanus, diphtheria, and pertussis (Tdap) vaccine  You may need a Td booster every 10 years. Varicella (chickenpox) vaccine  You may need this if you have not been vaccinated. Zoster (shingles) vaccine  You may need this after age 74. Measles, mumps, and rubella (MMR) vaccine  You may need at least one dose of MMR if you were born in 1957 or later. You may also need a second dose. Pneumococcal conjugate (PCV13) vaccine  You may need this if you have certain conditions and were not previously vaccinated. Pneumococcal polysaccharide (PPSV23) vaccine  You may need one or two doses if you smoke cigarettes or if you have certain conditions. Meningococcal conjugate (MenACWY) vaccine  You may need this if you have certain conditions. Hepatitis A vaccine  You may need this if you have certain conditions or if you travel or work in places where you may be exposed to hepatitis A. Hepatitis B vaccine  You may need this if you have certain conditions or if you travel or work in places where you may be exposed to hepatitis B. Haemophilus influenzae type b (Hib) vaccine  You may need this if you have certain conditions. Human papillomavirus (HPV) vaccine  If recommended by your health care provider, you may need three doses over 6 months. You may receive vaccines as individual doses or as more than one vaccine  together in one shot (combination vaccines). Talk with your health care provider about the risks and benefits of combination vaccines. What tests do I need? Blood tests  Lipid and cholesterol levels. These may be checked every 5 years, or more frequently if you are over 85 years old.  Hepatitis C test.  Hepatitis B test. Screening  Lung cancer screening. You may have this screening  every year starting at age 59 if you have a 30-pack-year history of smoking and currently smoke or have quit within the past 15 years.  Colorectal cancer screening. All adults should have this screening starting at age 59 and continuing until age 28. Your health care provider may recommend screening at age 59 if you are at increased risk. You will have tests every 1-10 years, depending on your results and the type of screening test.  Diabetes screening. This is done by checking your blood sugar (glucose) after you have not eaten for a while (fasting). You may have this done every 1-3 years.  Mammogram. This may be done every 1-2 years. Talk with your health care provider about when you should start having regular mammograms. This may depend on whether you have a family history of breast cancer.  BRCA-related cancer screening. This may be done if you have a family history of breast, ovarian, tubal, or peritoneal cancers.  Pelvic exam and Pap test. This may be done every 3 years starting at age 36. Starting at age 95, this may be done every 5 years if you have a Pap test in combination with an HPV test. Other tests  Sexually transmitted disease (STD) testing.  Bone density scan. This is done to screen for osteoporosis. You may have this scan if you are at high risk for osteoporosis. Follow these instructions at home: Eating and drinking  Eat a diet that includes fresh fruits and vegetables, whole grains, lean protein, and low-fat dairy.  Take vitamin and mineral supplements as recommended by your health care provider.  Do not drink alcohol if: ? Your health care provider tells you not to drink. ? You are pregnant, may be pregnant, or are planning to become pregnant.  If you drink alcohol: ? Limit how much you have to 0-1 drink a day. ? Be aware of how much alcohol is in your drink. In the [place] one drink equals one 12 oz bottle of beer (355 mL), one 5 oz glass of wine (148 mL), or one 1  oz glass of hard liquor (44 mL). Lifestyle  Take daily care of your teeth and gums.  Stay active. Exercise for at least 30 minutes on 5 or more days each week.  Do not use any products that contain nicotine or tobacco, such as cigarettes, e-cigarettes, and chewing tobacco. If you need help quitting, ask your health care provider.  If you are sexually active, practice safe sex. Use a condom or other form of birth control (contraception) in order to prevent pregnancy and STIs (sexually transmitted infections).  If told by your health care provider, take low-dose aspirin daily starting at age 24. What's next?  Visit your health care provider once a year for a well check visit.  Ask your health care provider how often you should have your eyes and teeth checked.  Stay up to date on all vaccines. This information is not intended to replace advice given to you by your health care provider. Make sure you discuss any questions you have with your health care provider. Document Revised:  12/08/2017 Document Reviewed: 12/08/2017 Elsevier Patient Education  [place], Adult The ears produce a substance called earwax that helps keep bacteria out of the ear and protects the skin in the ear canal. Occasionally, earwax can build up in the ear and cause discomfort or hearing loss. What increases the risk? This condition is more likely to develop in people who:  Are female.  Are elderly.  Naturally produce more earwax.  Clean their ears often with cotton swabs.  Use earplugs often.  Use in-ear headphones often.  Wear hearing aids.  Have narrow ear canals.  Have earwax that is overly thick or sticky.  Have eczema.  Are dehydrated.  Have excess hair in the ear canal. What are the signs or symptoms? Symptoms of this condition include:  Reduced or muffled hearing.  A feeling of fullness in the ear or feeling that the ear is plugged.  Fluid coming from the  ear.  Ear pain.  Ear itch.  Ringing in the ear.  Coughing.  An obvious piece of earwax that can be seen inside the ear canal. How is this diagnosed? This condition may be diagnosed based on:  Your symptoms.  Your medical history.  An ear exam. During the exam, your health care provider will look into your ear with an instrument called an otoscope. You may have tests, including a hearing test. How is this treated? This condition may be treated by:  Using ear drops to soften the earwax.  Having the earwax removed by a health care provider. The health care provider may: ? Flush the ear with water. ? Use an instrument that has a loop on the end (curette). ? Use a suction device.  Surgery to remove the wax buildup. This may be done in severe cases. Follow these instructions at home:   Take over-the-counter and prescription medicines only as told by your health care provider.  Do not put any objects, including cotton swabs, into your ear. You can clean the opening of your ear canal with a washcloth or facial tissue.  Follow instructions from your health care provider about cleaning your ears. Do not over-clean your ears.  Drink enough fluid to keep your urine clear or pale yellow. This will help to thin the earwax.  Keep all follow-up visits as told by your health care provider. If earwax builds up in your ears often or if you use hearing aids, consider seeing your health care provider for routine, preventive ear cleanings. Ask your health care provider how often you should schedule your cleanings.  If you have hearing aids, clean them according to instructions from the manufacturer and your health care provider. Contact a health care provider if:  You have ear pain.  You develop a fever.  You have blood, pus, or other fluid coming from your ear.  You have hearing loss.  You have ringing in your ears that does not go away.  Your symptoms do not improve with  treatment.  You feel like the room is spinning (vertigo). Summary  Earwax can build up in the ear and cause discomfort or hearing loss.  The most common symptoms of this condition include reduced or muffled hearing and a feeling of fullness in the ear or feeling that the ear is plugged.  This condition may be diagnosed based on your symptoms, your medical history, and an ear exam.  This condition may be treated by using ear drops to soften the earwax or by having  the earwax removed by a health care provider.  Do not put any objects, including cotton swabs, into your ear. You can clean the opening of your ear canal with a washcloth or facial tissue. This information is not intended to replace advice given to you by your health care provider. Make sure you discuss any questions you have with your health care provider. Document Revised: 03/11/2017 Document Reviewed: 06/09/2016 Elsevier Patient Education  [place].  Dyslipidemia Dyslipidemia is an imbalance of waxy, fat-like substances (lipids) in the blood. The body needs lipids in small amounts. Dyslipidemia often involves a high level of cholesterol or triglycerides, which are types of lipids. Common forms of dyslipidemia include:  High levels of LDL cholesterol. LDL is the type of cholesterol that causes fatty deposits (plaques) to build up in the blood vessels that carry blood away from your heart (arteries).  Low levels of HDL cholesterol. HDL cholesterol is the type of cholesterol that protects against heart disease. High levels of HDL remove the LDL buildup from arteries.  High levels of triglycerides. Triglycerides are a fatty substance in the blood that is linked to a buildup of plaques in the arteries. What are the causes? Primary dyslipidemia is caused by changes (mutations) in genes that are passed down through families (inherited). These mutations cause several types of dyslipidemia. Secondary dyslipidemia is caused  by lifestyle choices and diseases that lead to dyslipidemia, such as:  Eating a diet that is high in animal fat.  Not getting enough exercise.  Having diabetes, kidney disease, liver disease, or thyroid disease.  Drinking large amounts of alcohol.  Using certain medicines. What increases the risk? You are more likely to develop this condition if you are an older man or if you are a woman who has gone through menopause. Other risk factors include:  Having a family history of dyslipidemia.  Taking certain medicines, including birth control pills, steroids, some diuretics, and beta-blockers.  Smoking cigarettes.  Eating a high-fat diet.  Having certain medical conditions such as diabetes, polycystic ovary syndrome (PCOS), kidney disease, liver disease, or hypothyroidism.  Not exercising regularly.  Being overweight or obese with too much belly fat. What are the signs or symptoms? In most cases, dyslipidemia does not usually cause any symptoms. In severe cases, very high lipid levels can cause:  Fatty bumps under the skin (xanthomas).  White or gray ring around the black center (pupil) of the eye. Very high triglyceride levels can cause inflammation of the pancreas (pancreatitis). How is this diagnosed? Your health care provider may diagnose dyslipidemia based on a routine blood test (fasting blood test). Because most people do not have symptoms of the condition, this blood testing (lipid profile) is done on adults age 31 and older and is repeated every 5 years. This test checks:  Total cholesterol. This measures the total amount of cholesterol in your blood, including LDL cholesterol, HDL cholesterol, and triglycerides. A healthy number is below 200.  LDL cholesterol. The target number for LDL cholesterol is different for each person, depending on individual risk factors. Ask your health care provider what your LDL cholesterol should be.  HDL cholesterol. An HDL level of 60 or  higher is best because it helps to protect against heart disease. A number below 7 for men or below 52 for women increases the risk for heart disease.  Triglycerides. A healthy triglyceride number is below 150. If your lipid profile is abnormal, your health care provider may do other blood tests. How  is this treated? Treatment depends on the type of dyslipidemia that you have and your other risk factors for heart disease and stroke. Your health care provider will have a target range for your lipid levels based on this information. For many people, this condition may be treated by lifestyle changes, such as diet and exercise. Your health care provider may recommend that you:  Get regular exercise.  Make changes to your diet.  Quit smoking if you smoke. If diet changes and exercise do not help you reach your goals, your health care provider may also prescribe medicine to lower lipids. The most commonly prescribed type of medicine lowers your LDL cholesterol (statin drug). If you have a high triglyceride level, your provider may prescribe another type of drug (fibrate) or an omega-3 fish oil supplement, or both. Follow these instructions at home:  Eating and drinking  Follow instructions from your health care provider or dietitian about eating or drinking restrictions.  Eat a healthy diet as told by your health care provider. This can help you reach and maintain a healthy weight, lower your LDL cholesterol, and raise your HDL cholesterol. This may include: ? Limiting your calories, if you are overweight. ? Eating more fruits, vegetables, whole grains, fish, and lean meats. ? Limiting saturated fat, trans fat, and cholesterol.  If you drink alcohol: ? Limit how much you use. ? Be aware of how much alcohol is in your drink. In the [place] one drink equals one 12 oz bottle of beer (355 mL), one 5 oz glass of wine (148 mL), or one 1 oz glass of hard liquor (44 mL).  Do not drink alcohol  if: ? Your health care provider tells you not to drink. ? You are pregnant, may be pregnant, or are planning to become pregnant. Activity  Get regular exercise. Start an exercise and strength training program as told by your health care provider. Ask your health care provider what activities are safe for you. Your health care provider may recommend: ? 30 minutes of aerobic activity 4-6 days a week. Brisk walking is an example of aerobic activity. ? Strength training 2 days a week. General instructions  Do not use any products that contain nicotine or tobacco, such as cigarettes, e-cigarettes, and chewing tobacco. If you need help quitting, ask your health care provider.  Take over-the-counter and prescription medicines only as told by your health care provider. This includes supplements.  Keep all follow-up visits as told by your health care provider. Contact a health care provider if:  You are: ? Having trouble sticking to your exercise or diet plan. ? Struggling to quit smoking or control your use of alcohol. Summary  Dyslipidemia often involves a high level of cholesterol or triglycerides, which are types of lipids.  Treatment depends on the type of dyslipidemia that you have and your other risk factors for heart disease and stroke.  For many people, treatment starts with lifestyle changes, such as diet and exercise.  Your health care provider may prescribe medicine to lower lipids. This information is not intended to replace advice given to you by your health care provider. Make sure you discuss any questions you have with your health care provider. Document Revised: 11/21/2017 Document Reviewed: 10/28/2017 Elsevier Patient Education  [place].

## 2020-02-11 NOTE — Progress Notes (Signed)
New Patient Office Visit  Subjective:  Patient ID: Misty Osborne, female    DOB: February 27, 1961  Age: 59 y.o. MRN: [id number]  CC:  Chief Complaint  Patient presents with  . New Patient (Initial Visit)    establish care    HPI Misty Osborne is a 59 year old who presents to establish care with new primary care provider.  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 Her  recent provider, Dr. Darcus Austin from [place] just retired.  Patient has no complaints.  Her mother just passed away unexpectedly at age 50 years old with an MI.  The patient now would like to be checked for routine labs and reduce her cardiovascular risk profile.  HLD: tried medication but it made her feel lethargic and no energy worse-Lipitor, pravastatin. Not tried Crestor.   Low bone mass- Osteopenia: per DEXA by GYN. She presents on calcium.   ADD: Followed by Psychiatrist . No depression/anxiety. No SI or HI. for focus at work- Adderall 10 mg at breakfast takes 3- 4 times per week. 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  Immunizations: Diet:healthy diet  Exercise: yes-  Colonoscopy:She had one colonoscopy date unknown. FH colon cancer in father age 21  Dexa: osteopenia Pap Smear:GYN- this year [facility]: Pap /mammo done due this year.Takes estrogen progesterone BIJUVA 1-100 mg daily Mammogram:GYN-- this year  Dentist: UTD Vision: appt tomorrow Skin: wants Derm appt.in  May for freckles and age spots  Past Medical History:  Diagnosis Date  . Eating disorder   . Hyperlipidemia   . Osteopenia    DEXA   . UTI (urinary tract infection)     History reviewed. No pertinent surgical history.  Family History  Problem Relation Age of Onset  . Heart disease Mother   . Heart attack Mother   . Cancer Father     Social History   Socioeconomic History  . Marital status:  Married    Spouse name: Not on file  . Number of children: Not on file  . Years of education: Not on file  . Highest education level: Not on file  Occupational History  . Not on file  Tobacco Use  . Smoking status: Never Smoker  . Smokeless tobacco: Never Used  Vaping Use  . Vaping Use: Never used  Substance and Sexual Activity  . Alcohol use: Yes    Alcohol/week: 2.0 standard drinks    Types: 2 Glasses of wine per week    Comment: glass of wine x2 per week  . Drug use: Never  . Sexual activity: Yes  Other Topics Concern  . Not on file  Social History Narrative   Married. 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 She is grandmother of 51 month old granddaughter. Beagle named Health visitor.       Social Determinants of Health   Financial Resource Strain:   . Difficulty of Paying Living Expenses: Not on file  Food Insecurity:   . Worried About Charity fundraiser in the Last Year: Not on file  . Ran Out of Food in the Last Year: Not on file  Transportation Needs:   . Lack of Transportation (Medical): Not on file  . Lack of Transportation (Non-Medical): Not on file  Physical Activity:   . Days of Exercise per Week: Not on file  . Minutes of Exercise per Session: Not  on file  Stress:   . Feeling of Stress : Not on file  Social Connections:   . Frequency of Communication with Friends and Family: Not on file  . Frequency of Social Gatherings with Friends and Family: Not on file  . Attends Religious Services: Not on file  . Active Member of Clubs or Organizations: Not on file  . Attends Archivist Meetings: Not on file  . Marital Status: Not on file  Intimate Partner Violence:   . Fear of Current or Ex-Partner: Not on file  . Emotionally Abused: Not on file  . Physically Abused: Not on file  . Sexually Abused: Not on file    Review of Systems  Constitutional: Negative for chills and fever.  HENT: Negative.   Eyes: Negative.   Respiratory:  Negative.   Cardiovascular: Negative.   Gastrointestinal: Negative.   Endocrine: Negative.   Genitourinary: Negative.        Wakes  up 3-4 times at night to void. Drinks hot tea. Not drowsy during day. She does void a lot during the day.   Just a few UTI in her lifetime.   Musculoskeletal: Negative.   Skin: Negative.   Allergic/Immunologic: Negative.   Neurological: Negative.   Psychiatric/Behavioral: Negative.        No concern about anxiety and depression.     Objective:   Today's Vitals: BP 104/62 (BP Location: Left Arm, Patient Position: Sitting, Cuff Size: Normal)   Pulse 77   Temp 98.3 F (36.8 C) (Oral)   Ht '5\' 3"'  (1.6 m)   Wt 138 lb (62.6 kg)   SpO2 97%   BMI 24.45 kg/m   Physical Exam Constitutional:      Appearance: She is normal weight.  HENT:     Head: Normocephalic.     Right Ear: There is impacted cerumen.     Left Ear: Tympanic membrane normal. There is no impacted cerumen.     Nose: Nose normal.     Mouth/Throat:     Mouth: Mucous membranes are moist.     Pharynx: Oropharynx is clear.  Eyes:     Conjunctiva/sclera: Conjunctivae normal.     Pupils: Pupils are equal, round, and reactive to light.  Cardiovascular:     Rate and Rhythm: Normal rate and regular rhythm.     Pulses: Normal pulses.     Heart sounds: Normal heart sounds.  Pulmonary:     Effort: Pulmonary effort is normal.     Breath sounds: Normal breath sounds.  Abdominal:     Palpations: Abdomen is soft.     Tenderness: There is no abdominal tenderness.  Musculoskeletal:        General: Normal range of motion.     Cervical back: Normal range of motion.  Skin:    General: Skin is warm and dry.  Neurological:     General: No focal deficit present.     Mental Status: She is alert and oriented to person, place, and time.  Psychiatric:        Mood and Affect: Mood normal.        Behavior: Behavior normal.     Assessment & Plan:   Problem List Items Addressed This Visit       Nervous and Auditory   Impacted cerumen of right ear     Musculoskeletal and Integument   Osteopenia   Relevant Orders   VITAMIN D 25 Hydroxy (Vit-D Deficiency, Fractures) (Completed)     Other  Encounter for medical examination to establish care - Primary   Relevant Orders   CBC with Differential/Platelet (Completed)   TSH (Completed)   Comprehensive metabolic panel (Completed)   Hemoglobin A1c (Completed)   Hepatitis C antibody (Completed)   HIV Antibody (routine testing w rflx) (Completed)   B12 and Folate Panel (Completed)   Hyperlipidemia   Relevant Orders   Lipid panel (Completed)   Attention deficit disorder (ADD) without hyperactivity   Preventative health care    Other Visit Diagnoses    Need for immunization against influenza       Relevant Orders   Flu Vaccine QUAD 36+ mos IM (Completed)      Outpatient Encounter Medications as of 02/11/2020  Medication Sig  . amphetamine-dextroamphetamine (ADDERALL) 10 MG tablet Take 1 tablet (10 mg total) by mouth daily with breakfast.  . Estradiol-Progesterone (BIJUVA) 1-100 MG CAPS Take 1 tablet by mouth daily.  . Multiple Vitamin (MULTIVITAMIN ADULT PO) multivitamin  . propranolol (INDERAL) 20 MG tablet Take 20 mg by mouth daily as needed.  . [DISCONTINUED] amphetamine-dextroamphetamine (ADDERALL) 10 MG tablet Take 1 tablet (10 mg total) by mouth daily with breakfast.  . [DISCONTINUED] amphetamine-dextroamphetamine (ADDERALL) 10 MG tablet Take 1 tablet (10 mg total) by mouth daily with breakfast.  . [DISCONTINUED] azithromycin (ZITHROMAX) 250 MG tablet Take 1 tablet (250 mg total) by mouth daily. Take first 2 tablets together, then 1 every day until finished. (Patient not taking: Reported on 06/23/2018)  . [DISCONTINUED] benzonatate (TESSALON) 200 MG capsule Take 1 capsule (200 mg total) by mouth every 8 (eight) hours. Use during day/work (Patient not taking: Reported on 06/23/2018)  . [DISCONTINUED] cephALEXin (KEFLEX) 500 MG  capsule Take 1 capsule (500 mg total) by mouth 2 (two) times daily. (Patient not taking: Reported on 02/20/2019)  . [DISCONTINUED] HYDROcodone-homatropine (HYCODAN) 5-1.5 MG/5ML syrup Take 5 mLs by mouth every 6 (six) hours as needed for cough. Limit use to at home or bedtime. Do not drive or work after taking. (Patient not taking: Reported on 06/23/2018)  . [DISCONTINUED] pravastatin (PRAVACHOL) 10 MG tablet Take 10 mg by mouth daily.   No facility-administered encounter medications on file as of 02/11/2020.   Pt advised:  Please go to the lab today for routine physical exam.  Recommend gynecology evaluation for Pap test and mammogram if it is due this year.  Recommend checking with your GI provider regarding when your next colonoscopy is due.  Usually for family history of colon cancer colonoscopies are performed every 5 years.  For your osteopenia: Check your calcium supplements and let us know how much calcium you are taking every day.  Also check your vitamin D3 supplement and let us know how much you are taking.  Please check with your family practice group when your last tetanus vaccine was given.  That is due every 10 years.    You are also eligible for a [employer] booster.  Continue good work with diet, exercise, dental and vision regular visits.  Right ear with wax build up. Purchase Debrox over the counter and use according to directions. Please return next week for a nurse visit to get the ear irrigated.   Lab addendum: Your labs show normal chemistry (Sodium slightly low alk phos slightly low- to monitor) , kidney, liver, blood sugar, B12, Vit D, thyroid labs. Negative Hepatitis C and HIV screen.  CBC with normal hemoglobin, but slightly low hematocrit and RBC.  Would add iron panel to next blood draw.  Lipids:  The  cholesterol is elevated, and the LDL is elevated which is the bad cholesterol. 10-year ASCVD risk is 2.1%.  Advise a  diet rich in lean protein, nonfat low-fat  dairy, vegetables, fruits, complex carbohydrates with aerobic exercise  goal of 30 min x 5 times a week and add in twice weekly weight resistance training.   Follow-up: Return in about 3 months (around 05/13/2020) for 1 week nurse visit for right ear irrigation .    This visit occurred during the SARS-CoV-2 public health emergency.  Safety protocols were in place, including screening questions prior to the visit, additional usage of staff PPE, and extensive cleaning of exam room while observing appropriate contact time as indicated for disinfecting solutions.   Denice Paradise, NP

## 2020-02-12 LAB — B12 AND FOLATE PANEL
Folate: 10.9 ng/mL (ref >5.9)
Vitamin B-12: 1027 pg/mL — ABNORMAL HIGH (ref 211–911)

## 2020-02-12 LAB — COMPREHENSIVE METABOLIC PANEL
ALT: 8 U/L (ref 0–35)
AST: 17 U/L (ref 0–37)
Albumin: 4.2 g/dL (ref 3.5–5.2)
Alkaline Phosphatase: 36 U/L — ABNORMAL LOW (ref 39–117)
BUN: 18 mg/dL (ref 6–23)
CO2: 28 mEq/L (ref 19–32)
Calcium: 9.1 mg/dL (ref 8.4–10.5)
Chloride: 100 mEq/L (ref 96–112)
Creatinine, Ser: 0.85 mg/dL (ref 0.40–1.20)
GFR: 75.13 mL/min (ref >60.00)
Glucose, Bld: 85 mg/dL (ref 70–99)
Potassium: 4.1 mEq/L (ref 3.5–5.1)
Sodium: 134 mEq/L — ABNORMAL LOW (ref 135–145)
Total Bilirubin: 0.4 mg/dL (ref 0.2–1.2)
Total Protein: 6.5 g/dL (ref 6.0–8.3)

## 2020-02-12 LAB — LIPID PANEL
Cholesterol: 252 mg/dL — ABNORMAL HIGH (ref 0–200)
HDL: 66.4 mg/dL (ref >39.00)
LDL Cholesterol: 168 mg/dL — ABNORMAL HIGH (ref 0–99)
NonHDL: 186.03
Total CHOL/HDL Ratio: 4
Triglycerides: 91 mg/dL (ref 0.0–149.0)
VLDL: 18.2 mg/dL (ref 0.0–40.0)

## 2020-02-12 LAB — HEMOGLOBIN A1C: Hgb A1c MFr Bld: 5.5 % (ref 4.6–6.5)

## 2020-02-12 LAB — CBC WITH DIFFERENTIAL/PLATELET
Basophils Absolute: 0.1 10*3/uL (ref 0.0–0.1)
Basophils Relative: 0.8 % (ref 0.0–3.0)
Eosinophils Absolute: 0.1 10*3/uL (ref 0.0–0.7)
Eosinophils Relative: 2 % (ref 0.0–5.0)
HCT: 35.7 % — ABNORMAL LOW (ref 36.0–46.0)
Hemoglobin: 12.5 g/dL (ref 12.0–15.0)
Lymphocytes Relative: 24.1 % (ref 12.0–46.0)
Lymphs Abs: 1.7 10*3/uL (ref 0.7–4.0)
MCHC: 35 g/dL (ref 30.0–36.0)
MCV: 92.5 fl (ref 78.0–100.0)
Monocytes Absolute: 0.4 10*3/uL (ref 0.1–1.0)
Monocytes Relative: 5.3 % (ref 3.0–12.0)
Neutro Abs: 4.7 10*3/uL (ref 1.4–7.7)
Neutrophils Relative %: 67.8 % (ref 43.0–77.0)
Platelets: 245 10*3/uL (ref 150.0–400.0)
RBC: 3.86 Mil/uL — ABNORMAL LOW (ref 3.87–5.11)
RDW: 13.5 % (ref 11.5–15.5)
WBC: 7 10*3/uL (ref 4.0–10.5)

## 2020-02-12 LAB — VITAMIN D 25 HYDROXY (VIT D DEFICIENCY, FRACTURES): VITD: 37.73 ng/mL (ref 30.00–100.00)

## 2020-02-12 LAB — HEPATITIS C ANTIBODY
Hepatitis C Ab: NONREACTIVE
SIGNAL TO CUT-OFF: 0.21 (ref <1.00)

## 2020-02-12 LAB — TSH: TSH: 1.39 u[IU]/mL (ref 0.35–4.50)

## 2020-02-12 LAB — HIV ANTIBODY (ROUTINE TESTING W REFLEX): HIV 1&2 Ab, 4th Generation: NONREACTIVE

## 2020-02-19 ENCOUNTER — Ambulatory Visit (INDEPENDENT_AMBULATORY_CARE_PROVIDER_SITE_OTHER): Payer: No Typology Code available for payment source

## 2020-02-19 ENCOUNTER — Other Ambulatory Visit: Payer: Self-pay

## 2020-02-19 DIAGNOSIS — H6121 Impacted cerumen, right ear: Secondary | ICD-10-CM

## 2020-02-19 NOTE — Progress Notes (Signed)
Patient presented for Right ear irrigation due to cerumen impaction. Patient was informed of the possible side effects of having their ear flushed; light headedness, dizziness, nausea, vomiting and rupture ear drum. Items sed or that can be used are the elephant pump, catch basin, ear curettes, hydrogen peroxide (half a bottle for ear irrigation solution), and a stool softener (1-2CC for softening ear wax).  Patient has given a verbal consent to have ear irrigation. patient voiced no concerns nor showed any signs of distress during procedure.

## 2020-02-22 ENCOUNTER — Other Ambulatory Visit: Payer: Self-pay

## 2020-02-22 ENCOUNTER — Telehealth: Payer: Self-pay | Admitting: Physician Assistant

## 2020-02-22 NOTE — Telephone Encounter (Signed)
Pended for Misty Osborne to send Last refill 01/26/2020

## 2020-02-22 NOTE — Telephone Encounter (Signed)
Pt would like a refill on Adderall. Please send to [facility] at [address].

## 2020-02-24 MED ORDER — AMPHETAMINE-DEXTROAMPHETAMINE 10 MG PO TABS: 10.0000 mg | ORAL_TABLET | Freq: Every day | ORAL | 0 refills | Status: DC

## 2020-03-31 ENCOUNTER — Telehealth: Payer: Self-pay | Admitting: Physician Assistant

## 2020-03-31 ENCOUNTER — Other Ambulatory Visit: Payer: Self-pay | Admitting: Physician Assistant

## 2020-03-31 MED ORDER — AMPHETAMINE-DEXTROAMPHETAMINE 10 MG PO TABS: 10.0000 mg | ORAL_TABLET | Freq: Every day | ORAL | 0 refills | Status: DC

## 2020-03-31 NOTE — Telephone Encounter (Signed)
Prescription was sent

## 2020-03-31 NOTE — Telephone Encounter (Signed)
RF request for Misty Osborne's Adderall. [facility] [place]. Appt 1/6

## 2020-04-17 ENCOUNTER — Telehealth: Payer: Self-pay | Admitting: Physician Assistant

## 2020-04-17 ENCOUNTER — Encounter: Payer: Self-pay | Admitting: Physician Assistant

## 2020-04-17 ENCOUNTER — Telehealth (INDEPENDENT_AMBULATORY_CARE_PROVIDER_SITE_OTHER): Payer: No Typology Code available for payment source | Admitting: Physician Assistant

## 2020-04-17 DIAGNOSIS — F4010 Social phobia, unspecified: Secondary | ICD-10-CM

## 2020-04-17 DIAGNOSIS — F900 Attention-deficit hyperactivity disorder, predominantly inattentive type: Secondary | ICD-10-CM

## 2020-04-17 MED ORDER — AMPHETAMINE-DEXTROAMPHETAMINE 10 MG PO TABS: 10.0000 mg | ORAL_TABLET | Freq: Every day | ORAL | 0 refills | Status: DC

## 2020-04-17 NOTE — Progress Notes (Signed)
[facility]  Patient ID: Misty Osborne,  MRN: [id number]  PCP: Theadore Nan, NP  Date of Evaluation: 04/17/2020 Time spent:20 minutes  Chief Complaint:  Chief Complaint    ADD     Virtual Visit via Telehealth  I connected with patient by a video enabled telemedicine application with their informed consent, and verified patient privacy and that I am speaking with the correct person using two identifiers.  I am private, in my office and the patient is at  work.  I discussed the limitations, risks, security and privacy concerns of performing an evaluation and management service by video and the availability of in person appointments. I also discussed with the patient that there may be a patient responsible charge related to this service. The patient expressed understanding and agreed to proceed.   I discussed the assessment and treatment plan with the patient. The patient was provided an opportunity to ask questions and all were answered. The patient agreed with the plan and demonstrated an understanding of the instructions.   The patient was advised to call back or seek an in-person evaluation if the symptoms worsen or if the condition fails to improve as anticipated.  I provided 20 minutes of non-face-to-face time during this encounter.   HISTORY/CURRENT STATUS: HPI For routine six-month med check.  At LOV, we increased Adderall and it has worked well.  States that attention is good without easy distractibility.  Able to focus on things and finish tasks to completion.   She continues to take the propranolol before a big presentation, Rx by PCP. It works well.  Patient denies loss of interest in usual activities and is able to enjoy things.  Denies decreased energy or motivation.  Appetite has not changed.  No extreme sadness, tearfulness, or feelings of hopelessness.  Denies any changes in concentration, making decisions or remembering things.  Denies suicidal or  homicidal thoughts.   Denies muscle or joint pain, stiffness, or dystonia. Denies dizziness, syncope, seizures, numbness, tingling, tremor, tics, unsteady gait, slurred speech, confusion.   Individual Medical History/ Review of Systems: Changes? :No    Past medications for mental health diagnoses include: Prozac, Adderall, propranolol  Allergies: Patient has no known allergies.  Current Medications:  Current Outpatient Medications:  .  [START ON 05/30/2020] amphetamine-dextroamphetamine (ADDERALL) 10 MG tablet, Take 1 tablet (10 mg total) by mouth daily with breakfast., Disp: 30 tablet, Rfl: 0 .  [START ON 04/30/2020] amphetamine-dextroamphetamine (ADDERALL) 10 MG tablet, Take 1 tablet (10 mg total) by mouth daily with breakfast., Disp: 30 tablet, Rfl: 0 .  Estradiol-Progesterone (BIJUVA) 1-100 MG CAPS, Take 1 tablet by mouth daily., Disp: , Rfl:  .  Multiple Vitamin (MULTIVITAMIN ADULT PO), multivitamin, Disp: , Rfl:  .  propranolol (INDERAL) 20 MG tablet, Take 20 mg by mouth daily as needed., Disp: , Rfl: 5 .  [START ON 06/26/2020] amphetamine-dextroamphetamine (ADDERALL) 10 MG tablet, Take 1 tablet (10 mg total) by mouth daily with breakfast., Disp: 30 tablet, Rfl: 0 Medication Side Effects: none  Family Medical/ Social History: Changes? No   MENTAL HEALTH EXAM:  There were no vitals taken for this visit.There is no height or weight on file to calculate BMI.  General Appearance: Casual, Neat and Well Groomed  Eye Contact:  Good  Speech:  Clear and Coherent and Normal Rate  Volume:  Normal  Mood:  Euthymic  Affect:  Appropriate  Thought Process:  Goal Directed and Descriptions of Associations: Intact  Orientation:  Full (  Time, Place, and Person)  Thought Content: Logical   Suicidal Thoughts:  No  Homicidal Thoughts:  No  Memory:  WNL  Judgement:  Good  Insight:  Good  Psychomotor Activity:  Normal  Concentration:  Concentration: Good and Attention Span: Good  Recall:  Good   Fund of Knowledge: Good  Language: Good  Assets:  Desire for Improvement  ADL's:  Intact  Cognition: WNL  Prognosis:  Good    DIAGNOSES:    ICD-10-CM   1. Attention deficit hyperactivity disorder (ADHD), predominantly inattentive type  F90.0   2. Social anxiety disorder  F40.10     Receiving Psychotherapy: No    RECOMMENDATIONS:  PDMP was reviewed. Recommend increasing Adderall.  We agreed to bump it up slightly and try it for only 1 month.  Approximately 3 weeks then, she will call the office and let me know if it is working well or she prefers to decrease back to the current dose. Increase Adderall to 10 mg, 1 p.o. every morning. Continue propranolol as needed public speaking. Rx per PCP.  Return in 6 months.  [name], PA-C

## 2020-04-17 NOTE — Telephone Encounter (Signed)
[name], rhinehart are scheduled for a virtual visit with your provider today.    Just as we do with appointments in the office, we must obtain your consent to participate.  Your consent will be active for this visit and any virtual visit you may have with one of our providers in the next 365 days.    If you have a MyChart account, I can also send a copy of this consent to you electronically.  All virtual visits are billed to your insurance company just like a traditional visit in the office.  As this is a virtual visit, video technology does not allow for your provider to perform a traditional examination.  This may limit your provider's ability to fully assess your condition.  If your provider identifies any concerns that need to be evaluated in person or the need to arrange testing such as labs, EKG, etc, we will make arrangements to do so.    Although advances in technology are sophisticated, we cannot ensure that it will always work on either your end or our end.  If the connection with a video visit is poor, we may have to switch to a telephone visit.  With either a video or telephone visit, we are not always able to ensure that we have a secure connection.   I need to obtain your verbal consent now.   Are you willing to proceed with your visit today?   Misty Osborne has provided verbal consent on 04/17/2020 for a virtual visit (video or telephone).   [name], PA-C 04/17/2020  12:42 PM

## 2020-07-18 ENCOUNTER — Other Ambulatory Visit (HOSPITAL_COMMUNITY): Payer: Self-pay | Admitting: Family Medicine

## 2020-07-18 DIAGNOSIS — Z8249 Family history of ischemic heart disease and other diseases of the circulatory system: Secondary | ICD-10-CM

## 2020-08-01 ENCOUNTER — Telehealth: Payer: Self-pay | Admitting: Physician Assistant

## 2020-08-01 ENCOUNTER — Other Ambulatory Visit: Payer: Self-pay | Admitting: Physician Assistant

## 2020-08-01 MED ORDER — AMPHETAMINE-DEXTROAMPHETAMINE 10 MG PO TABS: 10.0000 mg | ORAL_TABLET | Freq: Every day | ORAL | 0 refills | Status: DC

## 2020-08-01 NOTE — Telephone Encounter (Signed)
Pt called and made an appt in June. She needs a refill on her adderall 10 mg [facility] [place]. She would like a 3 month supply sent to get her to her appt in june

## 2020-08-01 NOTE — Telephone Encounter (Signed)
Last filled 07/04/20 no refills remain

## 2020-08-01 NOTE — Telephone Encounter (Signed)
Rxs sent

## 2020-08-06 ENCOUNTER — Other Ambulatory Visit: Payer: Self-pay

## 2020-08-06 ENCOUNTER — Ambulatory Visit (HOSPITAL_COMMUNITY)
Admission: RE | Admit: 2020-08-06 | Discharge: 2020-08-06 | Disposition: A | Discharge status: Home / Self Care | Payer: Self-pay | Source: Ambulatory Visit | Attending: Family Medicine | Admitting: Family Medicine

## 2020-08-06 DIAGNOSIS — Z8249 Family history of ischemic heart disease and other diseases of the circulatory system: Secondary | ICD-10-CM | POA: Insufficient documentation

## 2020-09-04 ENCOUNTER — Other Ambulatory Visit: Payer: Self-pay

## 2020-09-04 ENCOUNTER — Telehealth: Payer: Self-pay | Admitting: Physician Assistant

## 2020-09-04 MED ORDER — AMPHETAMINE-DEXTROAMPHETAMINE 10 MG PO TABS: 10.0000 mg | ORAL_TABLET | Freq: Every day | ORAL | 0 refills | Status: DC

## 2020-09-04 NOTE — Telephone Encounter (Signed)
Patient lm requesting a refill on the Adderall. She stated she will be out before her scheduled appt on 6/23. Fill at [facility] #[id number] - [place], [place] - [address] AT [facility] [address] [address]  [address] [place], [place] [address]  Phone:  [phone] Fax:  [phone]

## 2020-09-04 NOTE — Telephone Encounter (Signed)
pended

## 2020-10-02 ENCOUNTER — Ambulatory Visit: Payer: No Typology Code available for payment source | Admitting: Physician Assistant

## 2020-10-02 ENCOUNTER — Encounter: Payer: Self-pay | Admitting: Physician Assistant

## 2020-10-02 ENCOUNTER — Other Ambulatory Visit: Payer: Self-pay

## 2020-10-02 VITALS — BP 103/65 | HR 72

## 2020-10-02 DIAGNOSIS — F900 Attention-deficit hyperactivity disorder, predominantly inattentive type: Secondary | ICD-10-CM | POA: Diagnosis not present

## 2020-10-02 DIAGNOSIS — F4010 Social phobia, unspecified: Secondary | ICD-10-CM | POA: Diagnosis not present

## 2020-10-02 MED ORDER — AMPHETAMINE-DEXTROAMPHETAMINE 10 MG PO TABS: 10.0000 mg | ORAL_TABLET | Freq: Every day | ORAL | 0 refills | Status: DC

## 2020-10-02 NOTE — Progress Notes (Signed)
[facility]  Patient ID: Misty Osborne,  MRN: [id number]  PCP: Misty Nan, NP  Date of Evaluation: 10/02/2020 Time spent:20 minutes  Chief Complaint:  Chief Complaint   ADD      HISTORY/CURRENT STATUS: HPI For routine six-month med check.  She is doing well.  Has no complaints.  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  That is going well.  She sleeps fine.  States that attention is good without easy distractibility.  Able to focus on things and finish tasks to completion.   Takes propranolol for social anxiety disorder.  PCP provides that.  She feels like it still works, does not need to use it too often.  Patient denies loss of interest in usual activities and is able to enjoy things.  Denies decreased energy or motivation.  Appetite has not changed.  No extreme sadness, tearfulness, or feelings of hopelessness. Denies suicidal or homicidal thoughts.  Denies muscle or joint pain, stiffness, or dystonia. Denies dizziness, syncope, seizures, numbness, tingling, tremor, tics, unsteady gait, slurred speech, confusion.   Individual Medical History/ Review of Systems: Changes? :Yes  had covid.    Past medications for mental health diagnoses include: Prozac, Adderall, propranolol  Allergies: Patient has no known allergies.  Current Medications:  Current Outpatient Medications:    Multiple Vitamin (MULTIVITAMIN ADULT PO), multivitamin, Disp: , Rfl:    propranolol (INDERAL) 20 MG tablet, Take 20 mg by mouth daily as needed., Disp: , Rfl: 5   [START ON 11/29/2020] amphetamine-dextroamphetamine (ADDERALL) 10 MG tablet, Take 1 tablet (10 mg total) by mouth daily with breakfast., Disp: 30 tablet, Rfl: 0   [START ON 10/31/2020] amphetamine-dextroamphetamine (ADDERALL) 10 MG tablet, Take 1 tablet (10 mg total) by mouth daily with breakfast., Disp: 30 tablet, Rfl: 0   amphetamine-dextroamphetamine (ADDERALL) 10 MG tablet, Take 1 tablet (10 mg  total) by mouth daily with breakfast., Disp: 30 tablet, Rfl: 0   Estradiol-Progesterone (BIJUVA) 1-100 MG CAPS, Take 1 tablet by mouth daily. (Patient not taking: Reported on 10/02/2020), Disp: , Rfl:  Medication Side Effects: none  Family Medical/ Social History: Changes? No   MENTAL HEALTH EXAM:  Blood pressure 103/65, pulse 72.There is no height or weight on file to calculate BMI.  General Appearance: Casual, Neat and Well Groomed  Eye Contact:  Good  Speech:  Clear and Coherent and Normal Rate  Volume:  Normal  Mood:  Euthymic  Affect:  Appropriate  Thought Process:  Goal Directed and Descriptions of Associations: Intact  Orientation:  Full (Time, Place, and Person)  Thought Content: Logical   Suicidal Thoughts:  No  Homicidal Thoughts:  No  Memory:  WNL  Judgement:  Good  Insight:  Good  Psychomotor Activity:  Normal  Concentration:  Concentration: Good and Attention Span: Good  Recall:  Good  Fund of Knowledge: Good  Language: Good  Assets:  Desire for Improvement  ADL's:  Intact  Cognition: WNL  Prognosis:  Good    DIAGNOSES:    ICD-10-CM   1. Attention deficit hyperactivity disorder (ADHD), predominantly inattentive type  F90.0     2. Social anxiety disorder  F40.10        Receiving Psychotherapy: No    RECOMMENDATIONS:  PDMP was reviewed.  Last Adderall 09/04/2020. I provided 20 minutes of face to face time during this encounter, including time spent before and after the visit in records review, medical decision making, and charting.  She's doing well with ADD so no changes in meds are needed. Continue Adderall 10 mg, 1 p.o. every morning. Continue propranolol 20 mg, 1 p.o. daily as needed anxiety, prescribed by PCP. Return in 6 months.  Misty Overly, PA-C

## 2020-11-16 ENCOUNTER — Other Ambulatory Visit: Payer: Self-pay

## 2020-11-16 ENCOUNTER — Emergency Department: Payer: No Typology Code available for payment source

## 2020-11-16 DIAGNOSIS — R079 Chest pain, unspecified: Secondary | ICD-10-CM | POA: Insufficient documentation

## 2020-11-16 DIAGNOSIS — Z5321 Procedure and treatment not carried out due to patient leaving prior to being seen by health care provider: Secondary | ICD-10-CM | POA: Insufficient documentation

## 2020-11-16 LAB — CBC
HCT: 35.1 % — ABNORMAL LOW (ref 36.0–46.0)
Hemoglobin: 12.1 g/dL (ref 12.0–15.0)
MCH: 31.7 pg (ref 26.0–34.0)
MCHC: 34.5 g/dL (ref 30.0–36.0)
MCV: 91.9 fL (ref 80.0–100.0)
Platelets: 233 10*3/uL (ref 150–400)
RBC: 3.82 MIL/uL — ABNORMAL LOW (ref 3.87–5.11)
RDW: 13.5 % (ref 11.5–15.5)
WBC: 5.6 10*3/uL (ref 4.0–10.5)
nRBC: 0 % (ref 0.0–0.2)

## 2020-11-16 LAB — BASIC METABOLIC PANEL
Anion gap: 6 (ref 5–15)
BUN: 20 mg/dL (ref 6–20)
CO2: 27 mmol/L (ref 22–32)
Calcium: 9.2 mg/dL (ref 8.9–10.3)
Chloride: 100 mmol/L (ref 98–111)
Creatinine, Ser: 0.87 mg/dL (ref 0.44–1.00)
GFR, Estimated: >60 mL/min (ref >60)
Glucose, Bld: 109 mg/dL — ABNORMAL HIGH (ref 70–99)
Potassium: 3.8 mmol/L (ref 3.5–5.1)
Sodium: 133 mmol/L — ABNORMAL LOW (ref 135–145)

## 2020-11-16 LAB — TROPONIN I (HIGH SENSITIVITY): Troponin I (High Sensitivity): 2 ng/L (ref <18)

## 2020-11-16 NOTE — ED Triage Notes (Signed)
Pt comes pov with cp radiating to her back while she was walking her dog. Not as bad while sitting. Denies HTN or heart hx. Mother died of MI.

## 2020-11-17 ENCOUNTER — Emergency Department
Admission: EM | Admit: 2020-11-17 | Discharge: 2020-11-17 | Disposition: A | Discharge status: Home / Self Care | Payer: No Typology Code available for payment source | Attending: Emergency Medicine | Admitting: Emergency Medicine

## 2020-11-17 LAB — TROPONIN I (HIGH SENSITIVITY): Troponin I (High Sensitivity): 4 ng/L (ref <18)

## 2020-11-17 NOTE — ED Notes (Signed)
Per registration, the pt left. 

## 2020-11-17 NOTE — ED Notes (Signed)
Lab called to draw troponin.

## 2021-01-09 ENCOUNTER — Other Ambulatory Visit: Payer: Self-pay

## 2021-01-09 ENCOUNTER — Telehealth: Payer: Self-pay | Admitting: Physician Assistant

## 2021-01-09 NOTE — Telephone Encounter (Signed)
3 Rx's pended for Misty Osborne to review and send

## 2021-01-09 NOTE — Telephone Encounter (Signed)
Pt requesting Rx for Adderall 10 mg 1/d. [facility] [address] Apt 12/20

## 2021-01-12 MED ORDER — AMPHETAMINE-DEXTROAMPHETAMINE 10 MG PO TABS: 10.0000 mg | ORAL_TABLET | Freq: Every day | ORAL | 0 refills | Status: DC

## 2021-01-12 MED ORDER — AMPHETAMINE-DEXTROAMPHETAMINE 10 MG PO TABS
10.0000 mg | ORAL_TABLET | Freq: Every day | ORAL | 0 refills | Status: DC
Start: 2021-01-12 — End: 2021-01-16

## 2021-01-15 ENCOUNTER — Telehealth: Payer: Self-pay | Admitting: Physician Assistant

## 2021-01-15 ENCOUNTER — Other Ambulatory Visit: Payer: Self-pay

## 2021-01-15 NOTE — Telephone Encounter (Signed)
pended

## 2021-01-15 NOTE — Telephone Encounter (Signed)
Once you are able to talk with them to see if they have either the 5 mg or 20 mg and I can send in a month supply of that.

## 2021-01-15 NOTE — Telephone Encounter (Signed)
She cannot get Adderall at current pharmacy [facility] in [place]. She checked and [facility] [address], [place], [place] Please send there.

## 2021-01-16 ENCOUNTER — Other Ambulatory Visit: Payer: Self-pay

## 2021-01-16 MED ORDER — AMPHETAMINE-DEXTROAMPHETAMINE 10 MG PO TABS: 10.0000 mg | ORAL_TABLET | Freq: Every day | ORAL | 0 refills | Status: DC

## 2021-01-16 NOTE — Telephone Encounter (Signed)
Patien

## 2021-02-16 ENCOUNTER — Telehealth: Payer: Self-pay | Admitting: Physician Assistant

## 2021-02-16 NOTE — Telephone Encounter (Signed)
Pt was all 3 outstanding scripts for Adderall transferred [facility] [address] in [place].  She said she confirmed they have it there.  She wants them all transferred there because they seem to have it more often than the [facility].  Next appt 12/20

## 2021-02-17 ENCOUNTER — Other Ambulatory Visit: Payer: Self-pay

## 2021-02-17 NOTE — Telephone Encounter (Signed)
Last refill 10/07 Pended 2 Rx's that were left for [facility], Rosey Bath will send

## 2021-02-18 MED ORDER — AMPHETAMINE-DEXTROAMPHETAMINE 10 MG PO TABS: 10.0000 mg | ORAL_TABLET | Freq: Every day | ORAL | 0 refills | Status: DC

## 2021-03-24 ENCOUNTER — Ambulatory Visit: Payer: No Typology Code available for payment source | Admitting: Orthopaedic Surgery

## 2021-03-24 ENCOUNTER — Encounter: Payer: Self-pay | Admitting: Orthopaedic Surgery

## 2021-03-24 ENCOUNTER — Other Ambulatory Visit: Payer: Self-pay

## 2021-03-24 ENCOUNTER — Ambulatory Visit: Payer: Self-pay

## 2021-03-24 DIAGNOSIS — M545 Low back pain, unspecified: Secondary | ICD-10-CM

## 2021-03-24 MED ORDER — DICLOFENAC SODIUM 75 MG PO TBEC: 75.0000 mg | DELAYED_RELEASE_TABLET | Freq: Two times a day (BID) | ORAL | 2 refills | Status: DC | PRN

## 2021-03-24 MED ORDER — PREDNISONE 10 MG (21) PO TBPK: ORAL_TABLET | ORAL | 0 refills | Status: DC

## 2021-03-24 NOTE — Progress Notes (Signed)
Office Visit Note   Patient: Misty Osborne           Date of Birth: [birth year]           MRN: [id number] Visit Date: 03/24/2021              Requested by: Theadore Nan, NP [address] [place],  [place] [address] PCP: Theadore Nan, NP   Assessment & Plan: Visit Diagnoses:  1. Acute right-sided low back pain, unspecified whether sciatica present     Plan: Impression is chronic right-sided low back pain with bilateral lower extremity radiculopathy and underlying scoliosis.  I would like to start the patient on a steroid taper followed by an anti-inflammatory.  I would also like to start her in physical therapy.  She agrees to this plan.  If her symptoms have not improved over the next 6 to 8 weeks or if they worsen before, she will call us and we will get an MRI of the lumbar spine.  Otherwise, follow-up with [place] as needed.  Follow-Up Instructions: Return if symptoms worsen or fail to improve.   Orders:  Orders Placed This Encounter  Procedures   XR Lumbar Spine 2-3 Views   Ambulatory referral to Physical Therapy   Meds ordered this encounter  Medications   predniSONE (STERAPRED UNI-PAK 21 TAB) 10 MG (21) TBPK tablet    Sig: Take as directed.  Do not take with any other antiinflammatories    Dispense:  21 tablet    Refill:  0   diclofenac (VOLTAREN) 75 MG EC tablet    Sig: Take 1 tablet (75 mg total) by mouth 2 (two) times daily as needed. Do not take until finished with steroid taper    Dispense:  60 tablet    Refill:  2       Procedures: No procedures performed   Clinical Data: No additional findings.   Subjective: Chief Complaint  Patient presents with   Lower Back - Pain    HPI patient is a pleasant 60 year old female who comes in today with lower back pain.  She has been dealing with this for years as she has underlying scoliosis.  She does note over the past month, she has had increased pain to the right lower back with occasional  radiculopathy to both legs.  Pain is worse with increased activity such as walking as well as towards the end of the day.  She denies any weakness to either leg.  She has not been taking medication for this.  She denies any bowel or bladder change or saddle paresthesias.  She has not been to physical therapy or had ESI's in the past.  Review of Systems as detailed in HPI.  All others reviewed and are negative.   Objective: Vital Signs: There were no vitals taken for this visit.  Physical Exam well-developed well-nourished female no acute distress.  Alert and oriented x3.  Ortho Exam lumbar spine exam shows no spinous or paraspinous tenderness.  She does have slight pain with lumbar extension.  No pain with flexion or rotation.  Negative straight leg raise.  No focal weakness.  She is neurovascular tact distally.  Specialty Comments:  No specialty comments available.  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 No pertinent surgical history. Social History   Occupational History   Not on file  Tobacco Use   Smoking status: Never   Smokeless tobacco: Never  Vaping Use   Vaping Use: Never used  Substance and Sexual Activity    Alcohol use: Yes    Alcohol/week: 2.0 standard drinks    Types: 2 Glasses of wine per week    Comment: glass of wine x2 per week   Drug use: Never   Sexual activity: Yes

## 2021-03-26 ENCOUNTER — Other Ambulatory Visit: Payer: Self-pay | Admitting: Family Medicine

## 2021-03-26 DIAGNOSIS — R911 Solitary pulmonary nodule: Secondary | ICD-10-CM

## 2021-03-30 ENCOUNTER — Ambulatory Visit: Payer: No Typology Code available for payment source | Admitting: Physician Assistant

## 2021-03-31 ENCOUNTER — Encounter: Payer: Self-pay | Admitting: Physician Assistant

## 2021-03-31 ENCOUNTER — Other Ambulatory Visit: Payer: Self-pay

## 2021-03-31 ENCOUNTER — Ambulatory Visit: Payer: No Typology Code available for payment source | Admitting: Physician Assistant

## 2021-03-31 DIAGNOSIS — F4010 Social phobia, unspecified: Secondary | ICD-10-CM | POA: Diagnosis not present

## 2021-03-31 DIAGNOSIS — F900 Attention-deficit hyperactivity disorder, predominantly inattentive type: Secondary | ICD-10-CM | POA: Diagnosis not present

## 2021-03-31 MED ORDER — AMPHETAMINE-DEXTROAMPHETAMINE 10 MG PO TABS: 10.0000 mg | ORAL_TABLET | Freq: Every day | ORAL | 0 refills | Status: DC

## 2021-03-31 NOTE — Progress Notes (Signed)
[facility] Med Check  Patient ID: Misty Osborne,  MRN: [id number]  PCP: Theadore Nan, NP  Date of Evaluation: 03/31/2021 Time spent:20 minutes  Chief Complaint:  Chief Complaint   Anxiety; ADD; Follow-up       HISTORY/CURRENT STATUS: HPI For routine six-month med check.  She is doing really well on the Adderall.  It is still effective.  There was several weeks that she was unable to get it because of the [employer], and she could tell she was off of it but being back on it has helped.  States that attention is good without easy distractibility.  Able to focus on things and finish tasks to completion.  The Adderall makes her more productive at work and home.  Takes propranolol for social anxiety disorder.  PCP provides that.  She feels like it still works, does not need to use it too often.  Patient denies loss of interest in usual activities and is able to enjoy things.  Denies decreased energy or motivation.  Appetite has not changed.  No extreme sadness, tearfulness, or feelings of hopelessness. Denies suicidal or homicidal thoughts.  Denies muscle or joint pain, stiffness, or dystonia. Denies dizziness, syncope, seizures, numbness, tingling, tremor, tics, unsteady gait, slurred speech, confusion.   Individual Medical History/ Review of Systems: Changes? :Yes   lumbar pain, has scoliosis.  Was given a steroid Dosepak last week and started Voltaren yesterday for the pain and inflammation.  Will be starting PT soon.  Past medications for mental health diagnoses include: Prozac, Adderall, propranolol  Allergies: Patient has no known allergies.  Current Medications:  Current Outpatient Medications:    [START ON 04/29/2021] amphetamine-dextroamphetamine (ADDERALL) 10 MG tablet, Take 1 tablet (10 mg total) by mouth daily with breakfast., Disp: 30 tablet, Rfl: 0   diclofenac (VOLTAREN) 75 MG EC tablet, Take 1 tablet (75 mg total) by mouth 2 (two) times daily as  needed. Do not take until finished with steroid taper, Disp: 60 tablet, Rfl: 2   Estradiol-Progesterone (BIJUVA) 1-100 MG CAPS, Take 1 tablet by mouth daily., Disp: , Rfl:    Multiple Vitamin (MULTIVITAMIN ADULT PO), multivitamin, Disp: , Rfl:    propranolol (INDERAL) 20 MG tablet, Take 20 mg by mouth daily as needed., Disp: , Rfl: 5   [START ON 06/25/2021] amphetamine-dextroamphetamine (ADDERALL) 10 MG tablet, Take 1 tablet (10 mg total) by mouth daily with breakfast., Disp: 30 tablet, Rfl: 0   [START ON 05/29/2021] amphetamine-dextroamphetamine (ADDERALL) 10 MG tablet, Take 1 tablet (10 mg total) by mouth daily with breakfast., Disp: 30 tablet, Rfl: 0   predniSONE (STERAPRED UNI-PAK 21 TAB) 10 MG (21) TBPK tablet, Take as directed.  Do not take with any other antiinflammatories (Patient not taking: Reported on 03/31/2021), Disp: 21 tablet, Rfl: 0 Medication Side Effects: none  Family Medical/ Social History: Changes? No   MENTAL HEALTH EXAM:  There were no vitals taken for this visit.There is no height or weight on file to calculate BMI.  General Appearance: Casual, Neat and Well Groomed  Eye Contact:  Good  Speech:  Clear and Coherent and Normal Rate  Volume:  Normal  Mood:  Euthymic  Affect:  Appropriate  Thought Process:  Goal Directed and Descriptions of Associations: Circumstantial  Orientation:  Full (Time, Place, and Person)  Thought Content: Logical   Suicidal Thoughts:  No  Homicidal Thoughts:  No  Memory:  WNL  Judgement:  Good  Insight:  Good  Psychomotor Activity:  Normal  Concentration:  Concentration: Good and Attention Span: Good  Recall:  Good  Fund of Knowledge: Good  Language: Good  Assets:  Desire for Improvement  ADL's:  Intact  Cognition: WNL  Prognosis:  Good    DIAGNOSES:    ICD-10-CM   1. Attention deficit hyperactivity disorder (ADHD), predominantly inattentive type  F90.0     2. Social anxiety disorder  F40.10        Receiving Psychotherapy:  No    RECOMMENDATIONS:  PDMP was reviewed.  Last Adderall filled 03/01/2021.   I provided 20 minutes of face to face time during this encounter, including time spent before and after the visit in records review, medical decision making, counseling pertinent to today's visit, and charting.  She is doing well so no changes are necessary.  If she has trouble getting the Adderall in the near future because of the [employer], I have asked her to check with her pharmacy and see if they have either the 5 mg or 20 mg and we can use those to keep her at her 10 mg dose.  If they do not, she will call around to other pharmacies, find where someone has it and then let us know.  Then I will send in a prescription.  She verbalizes understanding. Continue Adderall 10 mg, 1 p.o. every morning. Continue propranolol 20 mg, 1 p.o. daily as needed anxiety, prescribed by PCP. Return in 6 months.  [name], PA-C

## 2021-04-10 ENCOUNTER — Ambulatory Visit
Admission: RE | Admit: 2021-04-10 | Discharge: 2021-04-10 | Disposition: A | Discharge status: Home / Self Care | Payer: No Typology Code available for payment source | Source: Ambulatory Visit | Attending: Family Medicine | Admitting: Family Medicine

## 2021-04-10 DIAGNOSIS — R911 Solitary pulmonary nodule: Secondary | ICD-10-CM

## 2021-04-20 ENCOUNTER — Ambulatory Visit: Payer: No Typology Code available for payment source | Attending: Physician Assistant | Admitting: Physical Therapy

## 2021-04-20 ENCOUNTER — Encounter: Payer: Self-pay | Admitting: Physical Therapy

## 2021-04-20 DIAGNOSIS — M6281 Muscle weakness (generalized): Secondary | ICD-10-CM | POA: Insufficient documentation

## 2021-04-20 DIAGNOSIS — R262 Difficulty in walking, not elsewhere classified: Secondary | ICD-10-CM | POA: Diagnosis present

## 2021-04-20 DIAGNOSIS — M545 Low back pain, unspecified: Secondary | ICD-10-CM | POA: Diagnosis not present

## 2021-04-20 NOTE — Therapy (Signed)
[facility] [address], [place], [address] Phone: [phone]   Fax:  [phone]  Physical Therapy Evaluation  Patient Details  Name: Misty Osborne MRN: [id number] Date of Birth: 12/31/1960 Referring Provider (PT): Aundra Dubin, [place]   Encounter Date: 04/20/2021   PT End of Session - 04/21/21 1315     Visit Number 1    Number of Visits 24    Date for PT Re-Evaluation 07/13/21    Authorization Type [employer] reporting period from 04/20/2021    Authorization Time Period 30 per cal yr    Authorization - Visit Number 1    Authorization - Number of Visits 30    Progress Note Due on Visit 10    PT Start Time 1736    PT Stop Time 1815    PT Time Calculation (min) 39 min    Activity Tolerance Patient tolerated treatment well    Behavior During Therapy WFL for tasks assessed/performed             Past Medical History:  Diagnosis Date   Eating disorder    Hyperlipidemia    Osteopenia    DEXA    UTI (urinary tract infection)     History reviewed. No pertinent surgical history.  There were no vitals filed for this visit.    Subjective Assessment - 04/20/21 1741     Subjective Patient reports she has always had low back pain because of scoliosis. She states recently it has gotten worse on the right side when she is walking and standing. It is not bothering her much when she is sitting. She had an xray and it that showed some degenerative arthritis in that area. She is trying to head off being hunched over when she is 60 years old. She states she has had pain down both legs in the past but not this episode. It has likely been down past the knee. Both legs have been about the same. Current episode causes pain at the right lower back and sacral area. It has not gone down her leg this time. She states this episode of care started after walking 8 miles at the [facility]. She noticed it started to  hurt more that day and was hurting more over the next few days and she thinks this may have set it off. This was in November. Since that time it has been on and off and been about the same. She has continued to work out (treadmill and weights). She noticed a lot during the holidays doing a lot of cooking and standing. She took a steroid taper with no effect. She has tried some hand-held massage things and icy hot. These help a little bit. 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 She likes to spend time with her 82 year old grandchild. Traveling, building a house. She does feel like her balance is as good as it was. Exercise: 3-4x a week resistance training about 30 min, walking 3x a week 30 min.    Pertinent History Patient is a 61 y.o. female who presents to outpatient physical therapy with a referral for medical diagnosis  acute right-sided low back pain, unspecified whether sciatica present. This patient's chief complaints consist of acute episode of right sided low back pain on chronic low back pain with history of radicular symptoms to B LE leading to the following functional deficits:  anything that requires prolonged standing and walking, cooking, running errands, housework (sweeping), makes her exercise routing a bit harder, picking up her granddaughter.  Relevant past medical history and comorbidities include osteopenia.  Patient denies hx of cancer, stroke, seizures, lung problem, major cardiac events, diabetes, unexplained weight loss, changes in bowel or bladder problems, new onset stumbling or dropping things, accidents to injure back, spinal surgeries.    Limitations Lifting;Standing;Walking;House hold activities;Other (comment)   anything that requires prolonged standing and walking, cooking, running errands, housework (sweeping), makes her exercise routing a bit harder, picking up her granddaughter.   Diagnostic tests referring clinician note: X-rays  03/24/2021 "demonstrate moderate thoracolumbar scoliosis with multilevel degenerative changes"    Patient Stated Goals She is trying to head off being hunched over when she is 61 years old. She would like to be able to travel when she retires and that involves a lot of walking.    Currently in Pain? No/denies    Pain Score 0-No pain   W: 5-6/10. B: 0/10   Pain Location Back    Pain Orientation Right;Lower    Pain Descriptors / Indicators Aching    Pain Type Chronic pain    Pain Radiating Towards has radiated down legs in the past, but not this episode.    Pain Onset More than a month ago    Pain Frequency Intermittent    Aggravating Factors  standing, walking, bending a lot, lifting, twisting, going from sit to stand    Pain Relieving Factors sleeping, going to bed, mornings, icy hot and massage things    Effect of Pain on Daily Activities Functional Limitations: anything that requires prolonged standing and walking, cooking, running errands, housework (sweeping), makes her exercise routing a bit harder, picking up her granddaughter.             [facility] PT Assessment - 04/21/21 0001       Assessment   Medical Diagnosis acute right-sided low back pain, unspecified whether sciatica present    Referring Provider (PT) Cristie Hem, PA-C    Onset Date/Surgical Date --   november 2022   Next MD Visit does not think she has one scheduled    Prior Therapy none for this probleml prior to current episode of care      Precautions   Precautions None      Balance Screen   Has the patient fallen in the past 6 months No    Has the patient had a decrease in activity level because of a fear of falling?  No    Is the patient reluctant to leave their home because of a fear of falling?  No      Home Environment   Living Environment --   no concerns about getting around home safely     Prior Function   Level of Independence Independent    Vocation Full time employment    Publishing rights manager, ad Acupuncturist    Leisure traveling, buliding a house, 3 year old granddaughter      Cognition   Overall Cognitive Status Within Functional Limits for tasks assessed             OBJECTIVE  SELF- REPORTED FUNCTION FOTO score: 57/100 (lumbar spine questionnaire)  OBSERVATION/INSPECTION Posture Posture (standing): noted for significant levoscoliosis over lumbar spine, R iliac crest slightly higher than L Anthropometrics Tremor: none Functional Mobility Bed mobility: supine <> sit independent Transfers: sit <> stand independent Gait: grossly WFL for  household and short community ambulation. More detailed gait analysis deferred to later date as needed.   SPINE MOTION  Lumbar Spine AROM *Indicates pain Flexion: fingers to floor (normal ROM for pt), no increase in pain with OP Extension: 75% no increase in pain (feels kind of good). Mild pain at mid low back with OP.  Side Flexion:   R fingers to knee joint line, mildly uncomfortable with AROM/OP  L fingers to knee joint line, mild concordant pain Rotation:  R WFL with OP L mild concordant pain with AROM  NEUROLOGICAL Upper Motor Neuron Screen Hoffman's and Clonus (ankle) negative bilaterally.  Dermatomes L2-S2 appears equal and intact to light touch  PERIPHERAL JOINT MOTION (in degrees) Passive Range of Motion (PROM) Comments: B LE appears WFL with slight limitations to B hip extension with contralateral low back pain with end range OP.   MUSCLE PERFORMANCE (MMT):  *Indicates pain 04/20/20 Date Date  Joint/Motion R/L R/L R/L  Hip     Flexion (L1, L2) 4+/5 / /  Extension (knee ext) 4*/4+ / /  Abduction 4+/4+ / /  Adduction 4+/4+ / /  Knee     Extension (L3) 5/5 / /  Flexion (S2) 5/5 / /  Ankle/Foot     Dorsiflexion (L4) 5/5 / /  Great toe extension (L5) 5/5 / /  Eversion (S1) 5/5 / /  Comments: able to toe and heel walk with no UE support. Mild twinge in back with R hip extension.    SPECIAL TESTS:  LOWER LIMB NEURODYNAMIC Straight Leg Raise (Sciatic nerve)  R  = negative  L  = negative  HIP SPECIAL TESTS FABER: R = negative, L = negative.  ACCESSORY MOTION:  Concordant pain to CPA through ~L2-L5, worst at L5.   PALPATION: TTP grade I with over right lower lumbar paraspinals and more mild at left lower lumbar paraspinals. Increased development of right sided musculature and tension noted there.   Objective measurements completed on examination: See above findings.     PT Education - 04/21/21 1315     Education Details Education on diagnosis, prognosis, POC, anatomy and physiology of current condition    Person(s) Educated Patient    Methods Explanation    Comprehension Verbalized understanding;Need further instruction              PT Short Term Goals - 04/21/21 1328       PT SHORT TERM GOAL #1   Title Be independent with initial home exercise program for self-management of symptoms.    Baseline initial HEP to be provided visit 2 as appropriate (04/20/2021);    Time 2    Period Weeks    Status New    Target Date 05/05/21               PT Long Term Goals - 04/21/21 1328       PT LONG TERM GOAL #1   Title Be independent with a long-term home exercise program for self-management of symptoms.    Baseline Initial HEP to be provided at visit 2 as appropriate (04/20/2021);    Time 12    Period Weeks    Status New   TARGET DATE FOR ALL LONG TERM GOALS: 07/13/2021     PT LONG TERM GOAL #2   Title Demonstrate improved FOTO to equal or greater than 70 by visit #10 to demonstrate improvement in overall condition and self-reported functional ability.    Baseline 57 (04/20/2021);    Time 12  Period Weeks    Status New      PT LONG TERM GOAL #3   Title Have full lumbar  AROM with no compensations or increase in pain in all planes except intermittent end range discomfort and that expected from chronic scoliosis deformity to improve ability  to  maintain healthy posture and complete valued community activities with confidence such as bending and lifting.    Baseline end range pain with left rotation and side bending - see objective (04/20/2021);    Time 12    Period Weeks    Status New      PT LONG TERM GOAL #4   Title Improve B hip strength to equal or greater than 5/5 with no increase in pain to improve patients ability to complete functional tasks such as being active, walking, bending with less difficulty.    Baseline 4/5 and painful with R hip extension and some motions 4+/5 see objective exam (04/20/2021);    Time 12    Period Weeks    Status New      PT LONG TERM GOAL #5   Title Complete community, work and/or recreational activities without limitation due to current condition.    Baseline anything that requires prolonged standing and walking, cooking, running errands, housework (sweeping), makes her exercise routing a bit harder, picking up her granddaughter (04/20/2021);    Time 12    Period Weeks    Status New      Additional Long Term Goals   Additional Long Term Goals Yes      PT LONG TERM GOAL #6   Title Patient will demonstrate ability to lift 25# box floor to waist with good form and no increase in pain 5 times consecutively to improve patient's ability to pick up her 61 year old granddaughter.    Baseline patient reports pain/dififculty picking up granddaughter (04/20/2021);    Time 12    Period Weeks    Status New                    Plan - 04/21/21 1324     Clinical Impression Statement Patient is a 61 y.o. female referred to outpatient physical therapy with a medical diagnosis of acute right-sided low back pain, unspecified whether sciatica present who presents with signs and symptoms consistent with acute episode of right sided low back pain on chronic low back pain with history of radicular symptoms into both LE. Patient does have significant scoliosis with lumbar convexity to the left. Patient did not  have any clinical sign of neural tension or hip pathology at this time. Patient presents with significant pain, ROM, muscle tension, joint stiffness, posture, muscle performance (strength/power/endurance), and activity tolerance impairments that are limiting ability to complete anything that requires prolonged standing and walking, cooking, running errands, housework (sweeping), makes her exercise routing a bit harder, picking up her granddaughter without difficulty. Patient will benefit from skilled physical therapy intervention to address current body structure impairments and activity limitations to improve function and work towards goals set in current POC in order to return to prior level of function or maximal functional improvement.    Personal Factors and Comorbidities Past/Current Experience;Time since onset of injury/illness/exacerbation;Age;Comorbidity 1    Comorbidities osteopenia    Examination-Activity Limitations Squat;Lift;Bend;Caring for Others;Carry;Stand    Examination-Participation Restrictions Cleaning;Community Activity;Interpersonal Relationship   anything that requires prolonged standing and walking, cooking, running errands, housework (sweeping), makes her exercise routing a bit harder, picking up her granddaughter.  Stability/Clinical Decision Making Stable/Uncomplicated    Clinical Decision Making Low    Rehab Potential Good    PT Frequency 2x / week    PT Duration 12 weeks    PT Treatment/Interventions ADLs/Self Care Home Management;Cryotherapy;Electrical Stimulation;Moist Heat;Therapeutic activities;Therapeutic exercise;Balance training;Neuromuscular re-education;Dry needling;Manual techniques    PT Next Visit Plan updated HEP as appropriate, functiona/LE/core strengthening, consider futher assessment for directional preference    PT Home Exercise Plan TBD    Consulted and Agree with Plan of Care Patient             Patient will benefit from skilled therapeutic  intervention in order to improve the following deficits and impairments:  Pain, Decreased mobility, Increased muscle spasms, Postural dysfunction, Decreased activity tolerance, Decreased endurance, Decreased range of motion, Decreased strength, Hypomobility, Impaired perceived functional ability, Difficulty walking  Visit Diagnosis: Right-sided low back pain without sciatica, unspecified chronicity  Muscle weakness (generalized)  Difficulty in walking, not elsewhere classified     Problem List Patient Active Problem List   Diagnosis Date Noted   Encounter for medical examination to establish care 02/11/2020   Hyperlipidemia 02/11/2020   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 Graylon Good, PT, DPT 04/21/21, 1:36 PM  [facility] [address], [place], [address] Phone: [phone]   Fax:  [phone]  Name: Misty Osborne MRN: [id number] Date of Birth: 1960/10/16

## 2021-04-22 ENCOUNTER — Ambulatory Visit: Payer: No Typology Code available for payment source | Admitting: Physical Therapy

## 2021-04-27 ENCOUNTER — Encounter: Payer: No Typology Code available for payment source | Admitting: Physical Therapy

## 2021-04-29 ENCOUNTER — Encounter: Payer: No Typology Code available for payment source | Admitting: Physical Therapy

## 2021-04-29 ENCOUNTER — Other Ambulatory Visit: Payer: Self-pay | Admitting: Physician Assistant

## 2021-04-29 NOTE — Telephone Encounter (Signed)
Please re-pend for 5 mg #60. Thanks.

## 2021-04-29 NOTE — Telephone Encounter (Signed)
Pt called and said that the [facility] [address] has adderall 5 mg. So please cancel script and send in new script of 5 mg 2 x d

## 2021-04-30 MED ORDER — AMPHETAMINE-DEXTROAMPHETAMINE 5 MG PO TABS: 10.0000 mg | ORAL_TABLET | Freq: Every day | ORAL | 0 refills | Status: DC

## 2021-05-04 ENCOUNTER — Encounter: Payer: No Typology Code available for payment source | Admitting: Physical Therapy

## 2021-05-06 ENCOUNTER — Encounter: Payer: No Typology Code available for payment source | Admitting: Physical Therapy

## 2021-05-11 ENCOUNTER — Encounter: Payer: No Typology Code available for payment source | Admitting: Physical Therapy

## 2021-05-13 ENCOUNTER — Encounter: Payer: No Typology Code available for payment source | Admitting: Physical Therapy

## 2021-05-18 ENCOUNTER — Encounter: Payer: No Typology Code available for payment source | Admitting: Physical Therapy

## 2021-05-20 ENCOUNTER — Encounter: Payer: No Typology Code available for payment source | Admitting: Physical Therapy

## 2021-05-25 ENCOUNTER — Encounter: Payer: No Typology Code available for payment source | Admitting: Physical Therapy

## 2021-05-27 ENCOUNTER — Encounter: Payer: No Typology Code available for payment source | Admitting: Physical Therapy

## 2021-06-01 ENCOUNTER — Encounter: Payer: No Typology Code available for payment source | Admitting: Physical Therapy

## 2021-06-03 ENCOUNTER — Encounter: Payer: No Typology Code available for payment source | Admitting: Physical Therapy

## 2021-06-08 ENCOUNTER — Encounter: Payer: No Typology Code available for payment source | Admitting: Physical Therapy

## 2021-06-25 ENCOUNTER — Telehealth: Payer: Self-pay | Admitting: Physician Assistant

## 2021-06-25 NOTE — Telephone Encounter (Signed)
Filled 2/17

## 2021-06-25 NOTE — Telephone Encounter (Signed)
Next visit is 09/29/21. Misty Osborne is requesting a refill on her Adderall. She says her pharmacy has only 5 mg in stock and not 10 mg that she normally takes. Please call in to: ? ?[id number] - [place], [place] ? ?Phone:  [phone]  ?Fax:  [phone]  ? ? ? ? ? ?

## 2021-06-26 ENCOUNTER — Other Ambulatory Visit: Payer: Self-pay

## 2021-06-26 MED ORDER — AMPHETAMINE-DEXTROAMPHETAMINE 5 MG PO TABS: 10.0000 mg | ORAL_TABLET | Freq: Every day | ORAL | 0 refills | Status: DC

## 2021-06-26 NOTE — Telephone Encounter (Signed)
Pended.

## 2021-07-27 ENCOUNTER — Other Ambulatory Visit: Payer: Self-pay | Admitting: Physician Assistant

## 2021-07-27 ENCOUNTER — Telehealth: Payer: Self-pay | Admitting: Physician Assistant

## 2021-07-27 MED ORDER — AMPHETAMINE-DEXTROAMPHETAMINE 10 MG PO TABS: 10.0000 mg | ORAL_TABLET | Freq: Every day | ORAL | 0 refills | Status: DC

## 2021-07-27 NOTE — Telephone Encounter (Signed)
Prescription was sent

## 2021-07-27 NOTE — Telephone Encounter (Signed)
Patient called in for refill on Adderall 10mg . States that she has spoken with pharmacy and they have the 10mg  in stock. Ph: [phone]. Appt 6/20 [facility]acy [facility] [address] [place] ?

## 2021-08-31 ENCOUNTER — Telehealth: Payer: Self-pay | Admitting: Physician Assistant

## 2021-08-31 ENCOUNTER — Other Ambulatory Visit: Payer: Self-pay | Admitting: Physician Assistant

## 2021-08-31 MED ORDER — AMPHETAMINE-DEXTROAMPHETAMINE 10 MG PO TABS: 10.0000 mg | ORAL_TABLET | Freq: Every day | ORAL | 0 refills | Status: DC

## 2021-08-31 NOTE — Telephone Encounter (Signed)
Misty Osborne called this morning at 9:12 to request refill of her Adderall.  Appt 09/29/21.  [facility] [address] [place].  She said she called Friday and they had it.

## 2021-08-31 NOTE — Telephone Encounter (Signed)
Prescription was sent

## 2021-09-09 DIAGNOSIS — L538 Other specified erythematous conditions: Secondary | ICD-10-CM | POA: Diagnosis not present

## 2021-09-09 DIAGNOSIS — L820 Inflamed seborrheic keratosis: Secondary | ICD-10-CM | POA: Diagnosis not present

## 2021-09-09 DIAGNOSIS — Z85828 Personal history of other malignant neoplasm of skin: Secondary | ICD-10-CM | POA: Diagnosis not present

## 2021-09-09 DIAGNOSIS — L578 Other skin changes due to chronic exposure to nonionizing radiation: Secondary | ICD-10-CM | POA: Diagnosis not present

## 2021-09-09 DIAGNOSIS — Z08 Encounter for follow-up examination after completed treatment for malignant neoplasm: Secondary | ICD-10-CM | POA: Diagnosis not present

## 2021-09-29 ENCOUNTER — Ambulatory Visit (INDEPENDENT_AMBULATORY_CARE_PROVIDER_SITE_OTHER): Payer: BC Managed Care – PPO | Admitting: Physician Assistant

## 2021-09-29 ENCOUNTER — Encounter: Payer: Self-pay | Admitting: Physician Assistant

## 2021-09-29 DIAGNOSIS — F900 Attention-deficit hyperactivity disorder, predominantly inattentive type: Secondary | ICD-10-CM

## 2021-09-29 DIAGNOSIS — F4010 Social phobia, unspecified: Secondary | ICD-10-CM | POA: Diagnosis not present

## 2021-09-29 MED ORDER — AMPHETAMINE-DEXTROAMPHETAMINE 10 MG PO TABS: 10.0000 mg | ORAL_TABLET | Freq: Every day | ORAL | 0 refills | Status: DC

## 2021-09-29 NOTE — Progress Notes (Signed)
[facility]  Patient ID: Misty Osborne,  MRN: [id number]  PCP: Theadore Nan, NP  Date of Evaluation: 09/29/2021 Time spent:20 minutes  Chief Complaint:  Chief Complaint   ADD; Follow-up     HISTORY/CURRENT STATUS: HPI For routine six-month med check.  Doing really well.  The Adderall still works fine.  She has had trouble finding it due to the [employer], but only a couple of times.  She changed pharmacies due to this and [facility].  She is able to get things done in a timely manner without too many distractions.  She is able to enjoy things.  Energy and motivation are good.  Work is going well.  She sleeps well most of the time.  ADLs and personal hygiene are normal.  Appetite and weight are stable.  She occasionally gets anxious in a social situation but is not very often.  She takes the propranolol for that which does help.  Denies suicidal or homicidal thoughts.  Denies dizziness, syncope, seizures, numbness, tingling, tremor, tics, unsteady gait, slurred speech, confusion.  Has scoliosis and back pain off and on but otherwise no musculoskeletal abnormalities.  Individual Medical History/ Review of Systems: Changes? :No     Past medications for mental health diagnoses include: Prozac, Adderall, propranolol  Allergies: Patient has no known allergies.  Current Medications:  Current Outpatient Medications:    amphetamine-dextroamphetamine (ADDERALL) 10 MG tablet, Take 1 tablet (10 mg total) by mouth daily with breakfast., Disp: 30 tablet, Rfl: 0   Estradiol-Progesterone (BIJUVA) 1-100 MG CAPS, Take 1 tablet by mouth daily., Disp: , Rfl:    Multiple Vitamin (MULTIVITAMIN ADULT PO), multivitamin, Disp: , Rfl:    propranolol (INDERAL) 20 MG tablet, Take 20 mg by mouth daily as needed., Disp: , Rfl: 5   [START ON 11/27/2021] amphetamine-dextroamphetamine (ADDERALL) 10 MG tablet, Take 1 tablet (10 mg total) by mouth daily with  breakfast., Disp: 30 tablet, Rfl: 0   [START ON 10/28/2021] amphetamine-dextroamphetamine (ADDERALL) 10 MG tablet, Take 1 tablet (10 mg total) by mouth daily with breakfast., Disp: 30 tablet, Rfl: 0   diclofenac (VOLTAREN) 75 MG EC tablet, Take 1 tablet (75 mg total) by mouth 2 (two) times daily as needed. Do not take until finished with steroid taper (Patient not taking: Reported on 09/29/2021), Disp: 60 tablet, Rfl: 2 Medication Side Effects: none  Family Medical/ Social History: Changes?  She and her husband will be moving to [place] sometime in the next few months.  She will still be working in [place].  Has a new grandson born last week.   MENTAL HEALTH EXAM:  There were no vitals taken for this visit.There is no height or weight on file to calculate BMI.  General Appearance: Casual, Neat and Well Groomed  Eye Contact:  Good  Speech:  Clear and Coherent and Normal Rate  Volume:  Normal  Mood:  Euthymic  Affect:  Appropriate  Thought Process:  Goal Directed and Descriptions of Associations: Circumstantial  Orientation:  Full (Time, Place, and Person)  Thought Content: Logical   Suicidal Thoughts:  No  Homicidal Thoughts:  No  Memory:  WNL  Judgement:  Good  Insight:  Good  Psychomotor Activity:  Normal  Concentration:  Concentration: Good and Attention Span: Good  Recall:  Good  Fund of Knowledge: Good  Language: Good  Assets:  Desire for Improvement Financial Resources/Insurance Housing Transportation Vocational/Educational  ADL's:  Intact  Cognition: WNL  Prognosis:  Good    DIAGNOSES:    ICD-10-CM   1. Attention deficit hyperactivity disorder (ADHD), predominantly inattentive type  F90.0     2. Social anxiety disorder  F40.10       Receiving Psychotherapy: No    RECOMMENDATIONS:  PDMP was reviewed.  Last Adderall filled 08/31/2021. I provided 20 minutes of face to face time during this encounter, including time spent before and after the visit in records  review, medical decision making, counseling pertinent to today's visit, and charting.  She is doing well so no changes will be made.  I am sending in 3 separate prescriptions of the Adderall since her pharmacy has had it available every time except one in the past 3 months.  Hopefully they will continue.  Continue Adderall 10 mg, 1 p.o. every morning. Continue propranolol 20 mg, 1 p.o. daily as needed anxiety, prescribed by PCP. Return in 6 months.  [name], PA-C

## 2021-11-15 IMAGING — CR DG CHEST 2V
1 series · 2 of 2 positions shown · non-contrast
Comparison: CT 08/06/2020

CLINICAL DATA: Chest pain

EXAM:
CHEST - 2 VIEW

[Series 1: dg chest 2 view · 0.14mm/px · 2 of 2 slices shown]
[im 1/2]
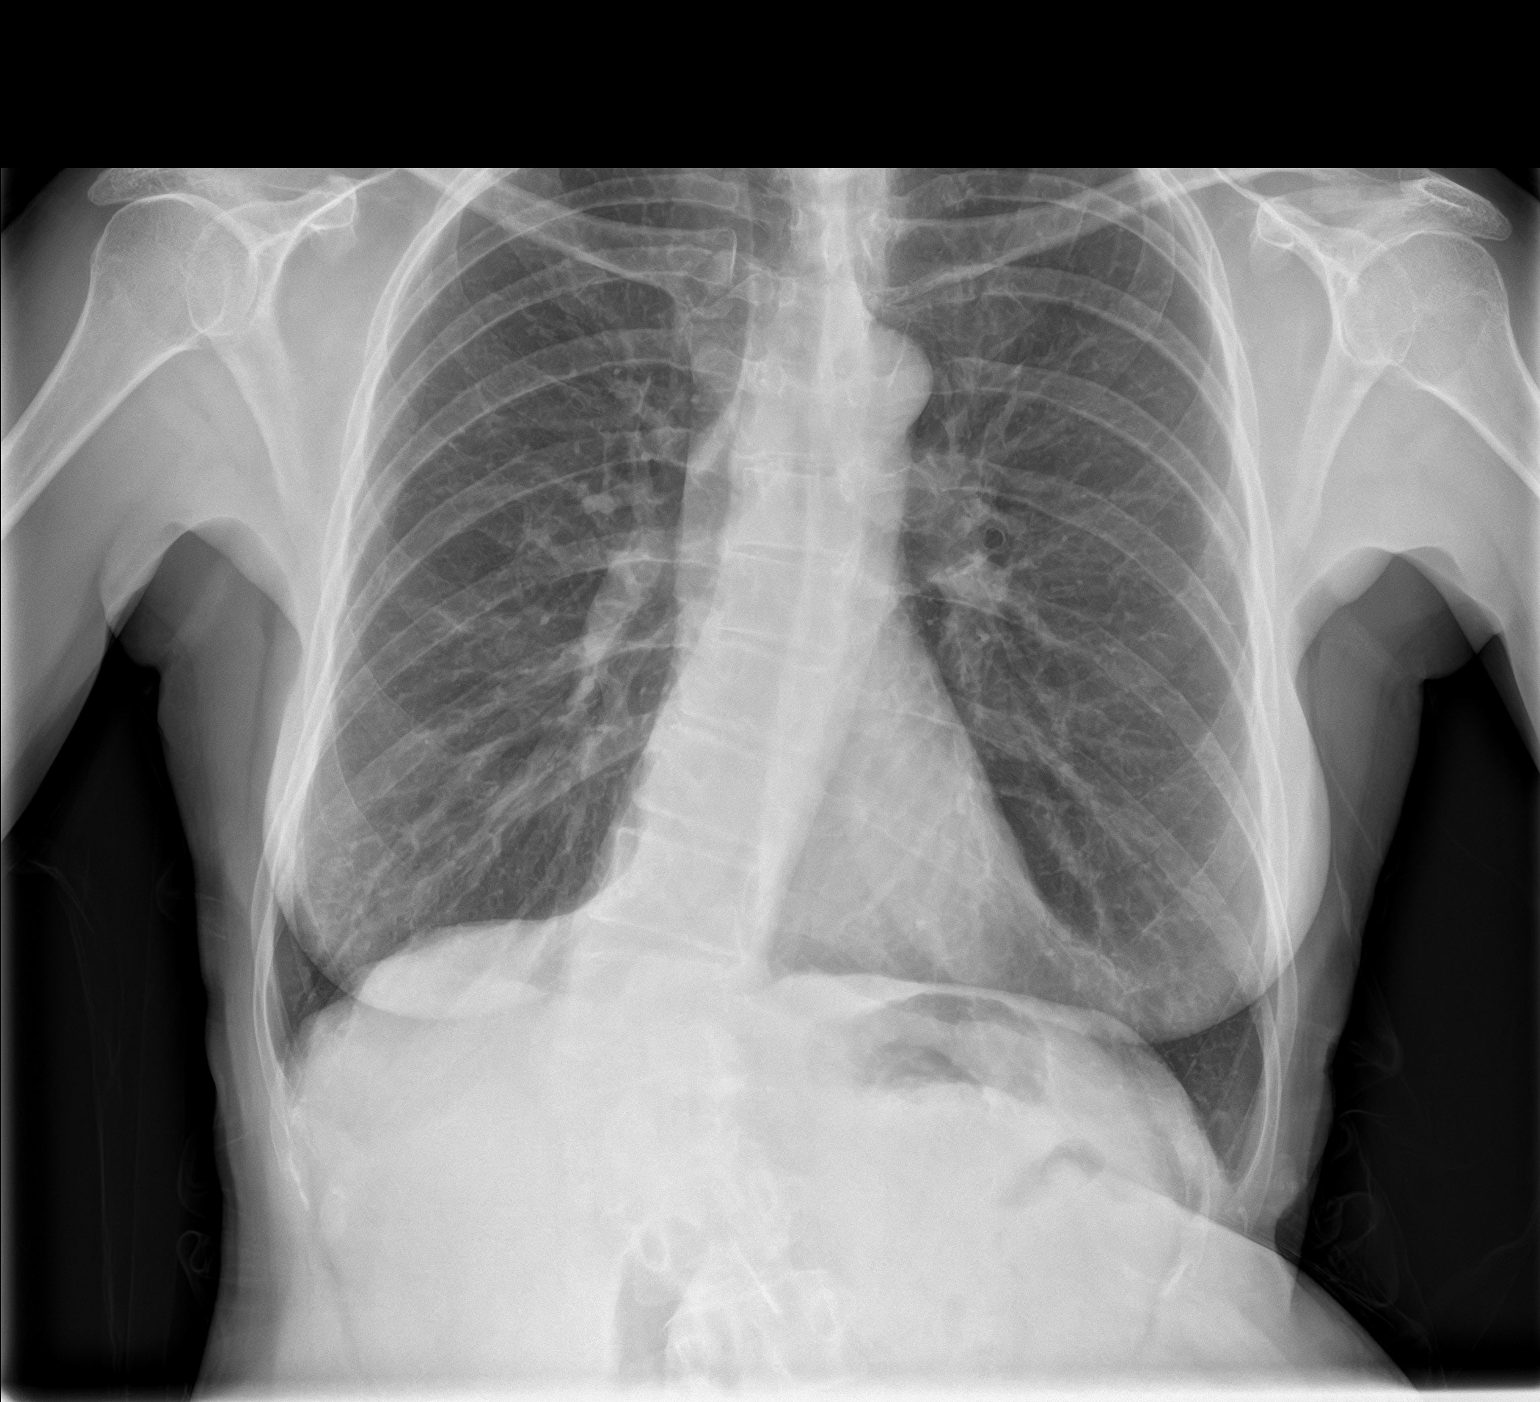
[im 2/2]
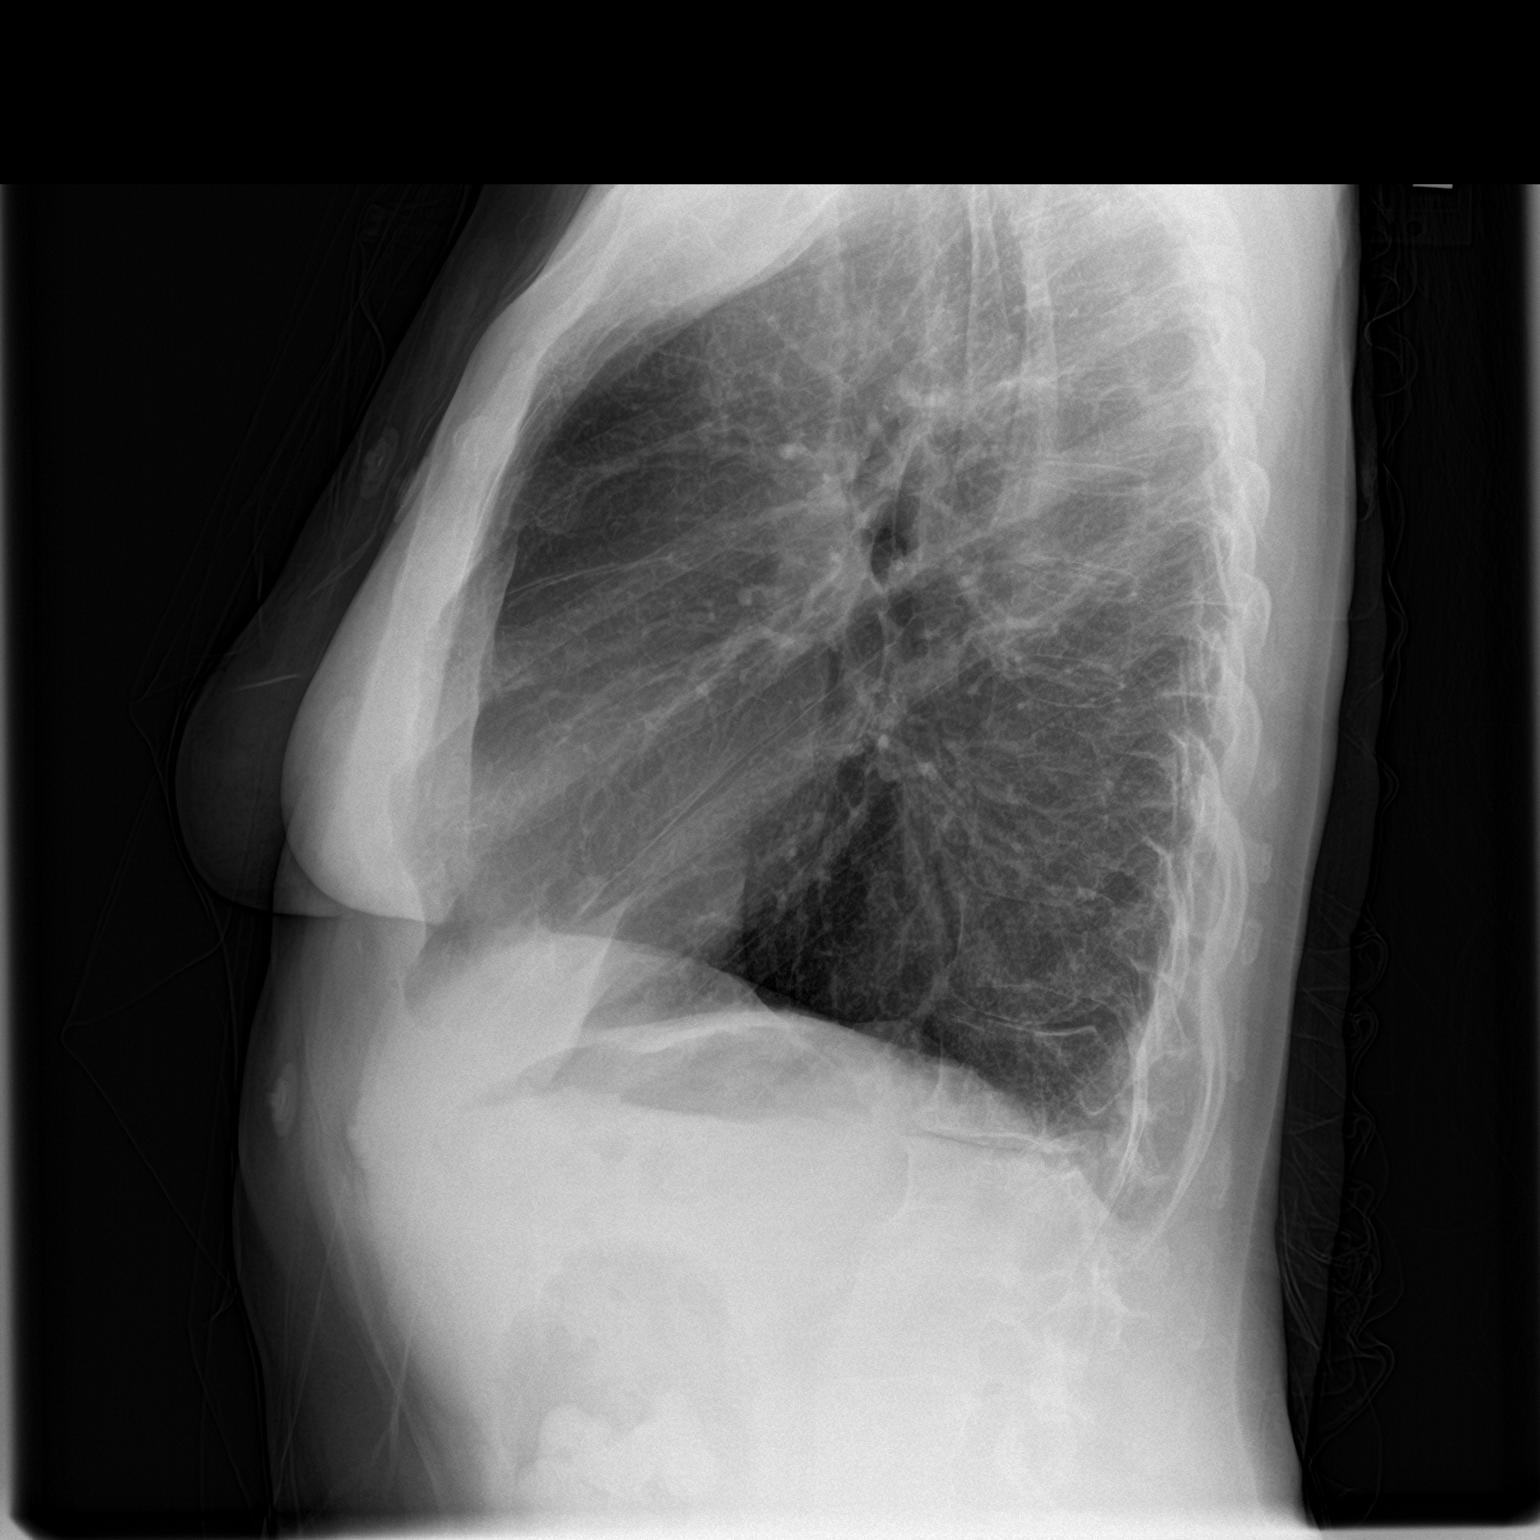

[2 of 2 positions shown; findings below may reference images not displayed]

FINDINGS: Heart and mediastinal contours are within normal limits. No focal
opacities or effusions. No acute bony abnormality. Thoracolumbar
scoliosis.
IMPRESSION: No active cardiopulmonary disease.

## 2021-11-26 DIAGNOSIS — R350 Frequency of micturition: Secondary | ICD-10-CM | POA: Diagnosis not present

## 2021-11-26 DIAGNOSIS — E7800 Pure hypercholesterolemia, unspecified: Secondary | ICD-10-CM | POA: Diagnosis not present

## 2021-11-26 DIAGNOSIS — Z79899 Other long term (current) drug therapy: Secondary | ICD-10-CM | POA: Diagnosis not present

## 2021-11-26 DIAGNOSIS — R351 Nocturia: Secondary | ICD-10-CM | POA: Diagnosis not present

## 2021-12-30 ENCOUNTER — Telehealth: Payer: Self-pay | Admitting: Physician Assistant

## 2021-12-30 ENCOUNTER — Other Ambulatory Visit: Payer: Self-pay | Admitting: Physician Assistant

## 2021-12-30 MED ORDER — AMPHETAMINE-DEXTROAMPHETAMINE 10 MG PO TABS: 10.0000 mg | ORAL_TABLET | Freq: Every day | ORAL | 0 refills | Status: DC

## 2021-12-30 NOTE — Telephone Encounter (Signed)
Prescriptions printed, signed, and put in front office box with note to call patient for pickup.

## 2021-12-30 NOTE — Telephone Encounter (Signed)
Pt needs written rx

## 2021-12-30 NOTE — Telephone Encounter (Signed)
Patient lvm stating she is in need of a written Rx for her Adderall. Message stated Misty Osborne usually write Rx for a 3 mos supply. Requesting a call when processed  Follow up appointment scheduled for : 12/19  Contact information # [phone]

## 2022-03-09 DIAGNOSIS — E785 Hyperlipidemia, unspecified: Secondary | ICD-10-CM | POA: Diagnosis not present

## 2022-03-09 DIAGNOSIS — D72819 Decreased white blood cell count, unspecified: Secondary | ICD-10-CM | POA: Diagnosis not present

## 2022-03-30 ENCOUNTER — Encounter: Payer: Self-pay | Admitting: Physician Assistant

## 2022-03-30 ENCOUNTER — Ambulatory Visit (INDEPENDENT_AMBULATORY_CARE_PROVIDER_SITE_OTHER): Payer: BC Managed Care – PPO | Admitting: Physician Assistant

## 2022-03-30 DIAGNOSIS — F900 Attention-deficit hyperactivity disorder, predominantly inattentive type: Secondary | ICD-10-CM | POA: Diagnosis not present

## 2022-03-30 DIAGNOSIS — F4010 Social phobia, unspecified: Secondary | ICD-10-CM | POA: Diagnosis not present

## 2022-03-30 MED ORDER — AMPHETAMINE-DEXTROAMPHETAMINE 10 MG PO TABS: 10.0000 mg | ORAL_TABLET | Freq: Every day | ORAL | 0 refills | Status: DC

## 2022-03-30 NOTE — Progress Notes (Signed)
[facility]  Patient ID: Misty Osborne,  MRN: [id number]  PCP: Theadore Nan, NP  Date of Evaluation: 03/30/2022 Time spent:20 minutes  Chief Complaint:  Chief Complaint   ADD; Follow-up    HISTORY/CURRENT STATUS: HPI For routine six-month med check.  She's doing well. Adderall is still working fine. States that attention is good without easy distractibility.  Able to focus on things and finish tasks to completion.   Patient is able to enjoy things.  Energy and motivation are good.  Work is going well.   No extreme sadness, tearfulness, or feelings of hopelessness.  Sleeps well most of the time. ADLs and personal hygiene are normal.   Appetite has not changed.  Weight is stable.  Occasionally takes propranolol for social anxiety.  It is helpful when needed.  She does not take it very often.  Denies suicidal or homicidal thoughts.  Denies dizziness, syncope, seizures, numbness, tingling, tremor, tics, unsteady gait, slurred speech, confusion.  Has scoliosis and back pain off and on but otherwise no musculoskeletal abnormalities.  Individual Medical History/ Review of Systems: Changes? :No     Past medications for mental health diagnoses include: Prozac, Adderall, propranolol  Allergies: Patient has no known allergies.  Current Medications:  Current Outpatient Medications:    Multiple Vitamin (MULTIVITAMIN ADULT PO), multivitamin, Disp: , Rfl:    propranolol (INDERAL) 20 MG tablet, Take 20 mg by mouth daily as needed., Disp: , Rfl: 5   [START ON 04/06/2022] amphetamine-dextroamphetamine (ADDERALL) 10 MG tablet, Take 1 tablet (10 mg total) by mouth daily with breakfast., Disp: 30 tablet, Rfl: 0   [START ON 05/06/2022] amphetamine-dextroamphetamine (ADDERALL) 10 MG tablet, Take 1 tablet (10 mg total) by mouth daily with breakfast., Disp: 30 tablet, Rfl: 0   [START ON 06/04/2022] amphetamine-dextroamphetamine (ADDERALL) 10 MG tablet, Take 1 tablet (10 mg total) by  mouth daily with breakfast., Disp: 30 tablet, Rfl: 0   diclofenac (VOLTAREN) 75 MG EC tablet, Take 1 tablet (75 mg total) by mouth 2 (two) times daily as needed. Do not take until finished with steroid taper (Patient not taking: Reported on 09/29/2021), Disp: 60 tablet, Rfl: 2   Estradiol-Progesterone (BIJUVA) 1-100 MG CAPS, Take 1 tablet by mouth daily. (Patient not taking: Reported on 03/30/2022), Disp: , Rfl:  Medication Side Effects: none  Family Medical/ Social History: Changes?  She and her husband moved to [place]. She is still working in [place], works from home 3 days a wk and then commutes 2 days a week.   MENTAL HEALTH EXAM:  There were no vitals taken for this visit.There is no height or weight on file to calculate BMI.  General Appearance: Casual, Neat and Well Groomed  Eye Contact:  Good  Speech:  Clear and Coherent and Normal Rate  Volume:  Normal  Mood:  Euthymic  Affect:  Appropriate  Thought Process:  Goal Directed and Descriptions of Associations: Circumstantial  Orientation:  Full (Time, Place, and Person)  Thought Content: Logical   Suicidal Thoughts:  No  Homicidal Thoughts:  No  Memory:  WNL  Judgement:  Good  Insight:  Good  Psychomotor Activity:  Normal  Concentration:  Concentration: Good and Attention Span: Good  Recall:  Good  Fund of Knowledge: Good  Language: Good  Assets:  Desire for Improvement Financial Resources/Insurance Housing Physical Health Transportation Vocational/Educational  ADL's:  Intact  Cognition: WNL  Prognosis:  Good   DIAGNOSES:    ICD-10-CM   1. Attention deficit hyperactivity  disorder (ADHD), predominantly inattentive type  F90.0     2. Social anxiety disorder  F40.10       Receiving Psychotherapy: No   RECOMMENDATIONS:  PDMP was reviewed.  Last Adderall filled 03/08/2022. I provided 20 minutes of face to face time during this encounter, including time spent before and after the visit in records review,  medical decision making, counseling pertinent to today's visit, and charting.   She is doing well so no change will be made.  Continue Adderall 10 mg, 1 p.o. every morning. Continue propranolol 20 mg, 1 p.o. daily as needed anxiety, prescribed by PCP.  She rarely takes it. Return in 6 months.  [name], PA-C

## 2022-06-09 DIAGNOSIS — J069 Acute upper respiratory infection, unspecified: Secondary | ICD-10-CM | POA: Diagnosis not present

## 2022-07-02 ENCOUNTER — Telehealth: Payer: Self-pay | Admitting: Physician Assistant

## 2022-07-02 ENCOUNTER — Other Ambulatory Visit: Payer: Self-pay | Admitting: Physician Assistant

## 2022-07-02 MED ORDER — AMPHETAMINE-DEXTROAMPHETAMINE 10 MG PO TABS: 10.0000 mg | ORAL_TABLET | Freq: Every day | ORAL | 0 refills | Status: DC

## 2022-07-02 NOTE — Telephone Encounter (Signed)
Pt called and said that she would like 3 months of her adderall 10 mg printed out and she will come pick them up.

## 2022-07-02 NOTE — Telephone Encounter (Signed)
Rxs written, printed and signed, put in admin box w/ note that she will p/u.

## 2022-07-13 ENCOUNTER — Encounter (HOSPITAL_BASED_OUTPATIENT_CLINIC_OR_DEPARTMENT_OTHER): Payer: Self-pay

## 2022-07-20 ENCOUNTER — Encounter (HOSPITAL_BASED_OUTPATIENT_CLINIC_OR_DEPARTMENT_OTHER): Payer: Self-pay

## 2022-07-20 ENCOUNTER — Institutional Professional Consult (permissible substitution) (HOSPITAL_BASED_OUTPATIENT_CLINIC_OR_DEPARTMENT_OTHER): Payer: BC Managed Care – PPO | Admitting: Internal Medicine

## 2022-09-30 ENCOUNTER — Encounter: Payer: Self-pay | Admitting: Physician Assistant

## 2022-09-30 ENCOUNTER — Ambulatory Visit: Payer: BC Managed Care – PPO | Admitting: Physician Assistant

## 2022-09-30 DIAGNOSIS — F4010 Social phobia, unspecified: Secondary | ICD-10-CM | POA: Diagnosis not present

## 2022-09-30 DIAGNOSIS — F900 Attention-deficit hyperactivity disorder, predominantly inattentive type: Secondary | ICD-10-CM | POA: Diagnosis not present

## 2022-09-30 MED ORDER — AMPHETAMINE-DEXTROAMPHETAMINE 10 MG PO TABS: 10.0000 mg | ORAL_TABLET | Freq: Every day | ORAL | 0 refills | Status: DC

## 2022-09-30 NOTE — Progress Notes (Signed)
[facility]  Patient ID: Dondi C [place],  MRN: [id number]  PCP: Theadore Nan, NP  Date of Evaluation: 09/30/2022 Time spent: 18 minutes  Chief Complaint:  Chief Complaint   ADD; Follow-up    HISTORY/CURRENT STATUS: HPI For routine six-month med check.  Doing well. States that attention is good without easy distractibility.  Able to focus on things and finish tasks to completion.   Patient is able to enjoy things.  Energy and motivation are good.  Work is going well.   No extreme sadness, tearfulness, or feelings of hopelessness.  Sleeps well most of the time. ADLs and personal hygiene are normal.  Appetite has not changed.  Weight is stable. Anxiety is well-controlled. In some social situations, she gets nervous and will take the Inderal but not often. Denies suicidal or homicidal thoughts.  Denies dizziness, syncope, seizures, numbness, tingling, tremor, tics, unsteady gait, slurred speech, confusion.  Has scoliosis and back pain off and on but otherwise no musculoskeletal abnormalities.  Individual Medical History/ Review of Systems: Changes? :No     Past medications for mental health diagnoses include: Prozac, Adderall, propranolol  Allergies: Lipitor [atorvastatin]  Current Medications:  Current Outpatient Medications:    [START ON 10/11/2022] amphetamine-dextroamphetamine (ADDERALL) 10 MG tablet, Take 1 tablet (10 mg total) by mouth daily with breakfast., Disp: 30 tablet, Rfl: 0   [START ON 11/08/2022] amphetamine-dextroamphetamine (ADDERALL) 10 MG tablet, Take 1 tablet (10 mg total) by mouth daily with breakfast., Disp: 30 tablet, Rfl: 0   [START ON 12/08/2022] amphetamine-dextroamphetamine (ADDERALL) 10 MG tablet, Take 1 tablet (10 mg total) by mouth daily with breakfast., Disp: 30 tablet, Rfl: 0   estradiol (CLIMARA - DOSED IN MG/24 HR) 0.05 mg/24hr patch, Place 0.05 mg onto the skin once a week., Disp: , Rfl:    Multiple Vitamin (MULTIVITAMIN ADULT PO),  multivitamin, Disp: , Rfl:    propranolol (INDERAL) 20 MG tablet, Take 20 mg by mouth daily as needed., Disp: , Rfl: 5   rosuvastatin (CRESTOR) 5 MG tablet, Take 5 mg by mouth daily. (Patient not taking: Reported on 09/30/2022), Disp: , Rfl:  Medication Side Effects: none  Family Medical/ Social History: Changes?  No  MENTAL HEALTH EXAM:  There were no vitals taken for this visit.There is no height or weight on file to calculate BMI.  General Appearance: Casual, Neat and Well Groomed  Eye Contact:  Good  Speech:  Clear and Coherent and Normal Rate  Volume:  Normal  Mood:  Euthymic  Affect:  Congruent  Thought Process:  Goal Directed and Descriptions of Associations: Circumstantial  Orientation:  Full (Time, Place, and Person)  Thought Content: Logical   Suicidal Thoughts:  No  Homicidal Thoughts:  No  Memory:  WNL  Judgement:  Good  Insight:  Good  Psychomotor Activity:  Normal  Concentration:  Concentration: Good and Attention Span: Good  Recall:  Good  Fund of Knowledge: Good  Language: Good  Assets:  Desire for Improvement Financial Resources/Insurance Housing Physical Health Transportation Vocational/Educational  ADL's:  Intact  Cognition: WNL  Prognosis:  Good   DIAGNOSES:    ICD-10-CM   1. Attention deficit hyperactivity disorder (ADHD), predominantly inattentive type  F90.0     2. Social anxiety disorder  F40.10       Receiving Psychotherapy: No   RECOMMENDATIONS:  PDMP was reviewed.  Last Adderall filled 09/13/2022. I provided 18 minutes of face to face time during this encounter, including time spent before and after the  visit in records review, medical decision making, counseling pertinent to today's visit, and charting.   She's doing well so no changes are needed.   Continue Adderall 10 mg, 1 p.o. every morning. Continue propranolol 20 mg, 1 p.o. daily as needed anxiety, prescribed by PCP.  She rarely takes it. Return in 6 months.  [name],  PA-C

## 2022-10-28 DIAGNOSIS — M8580 Other specified disorders of bone density and structure, unspecified site: Secondary | ICD-10-CM | POA: Diagnosis not present

## 2022-10-28 DIAGNOSIS — Z1231 Encounter for screening mammogram for malignant neoplasm of breast: Secondary | ICD-10-CM | POA: Diagnosis not present

## 2022-10-28 DIAGNOSIS — N951 Menopausal and female climacteric states: Secondary | ICD-10-CM | POA: Diagnosis not present

## 2022-10-28 DIAGNOSIS — Z01411 Encounter for gynecological examination (general) (routine) with abnormal findings: Secondary | ICD-10-CM | POA: Diagnosis not present

## 2023-01-03 ENCOUNTER — Telehealth: Payer: Self-pay | Admitting: Physician Assistant

## 2023-01-03 NOTE — Telephone Encounter (Signed)
LF 9/3, due 10/2.

## 2023-01-03 NOTE — Telephone Encounter (Signed)
Patient called in  requesting written prescription for Adderall 10mg . Ph: [phone] Appt 12/17 Please call when prescription is ready for pickup

## 2023-01-05 NOTE — Telephone Encounter (Signed)
Pt asking for written rx of adderall 10 mg to pick up on 09/26. Is that okay to print and have you sign? Last refill 09/03

## 2023-01-05 NOTE — Telephone Encounter (Signed)
Pt called on 9/25 and LM. She is coming to [facility] tomorrow and checking on the status of picking up the Adderall written RX.

## 2023-01-06 ENCOUNTER — Other Ambulatory Visit: Payer: Self-pay | Admitting: Physician Assistant

## 2023-01-06 MED ORDER — AMPHETAMINE-DEXTROAMPHETAMINE 10 MG PO TABS: 10.0000 mg | ORAL_TABLET | Freq: Every day | ORAL | 0 refills | Status: DC

## 2023-01-06 NOTE — Telephone Encounter (Signed)
Reviewed

## 2023-01-06 NOTE — Telephone Encounter (Signed)
I've printed 3 Rx, signed, and put in admin box, asking them to call her to pick up.

## 2023-01-06 NOTE — Telephone Encounter (Signed)
Noted, thank you

## 2023-01-13 ENCOUNTER — Encounter: Payer: Self-pay | Admitting: Internal Medicine

## 2023-01-13 ENCOUNTER — Telehealth: Payer: Self-pay | Admitting: Internal Medicine

## 2023-01-13 ENCOUNTER — Ambulatory Visit: Payer: BC Managed Care – PPO | Attending: Internal Medicine | Admitting: Internal Medicine

## 2023-01-13 VITALS — BP 108/66 | HR 78 | Ht 64.0 in | Wt 145.0 lb

## 2023-01-13 DIAGNOSIS — E7800 Pure hypercholesterolemia, unspecified: Secondary | ICD-10-CM | POA: Diagnosis not present

## 2023-01-13 DIAGNOSIS — E7801 Familial hypercholesterolemia: Secondary | ICD-10-CM | POA: Diagnosis not present

## 2023-01-13 MED ORDER — ROSUVASTATIN CALCIUM 5 MG PO TABS: 5.0000 mg | ORAL_TABLET | Freq: Every day | ORAL | 3 refills | Status: DC

## 2023-01-13 NOTE — Telephone Encounter (Signed)
Genetic test for dyslipidemia/ascvd ordered ([facility]) Cheek swab completed in office Specimen and necessary paperwork mailed. ID: [id number]

## 2023-01-13 NOTE — Progress Notes (Signed)
LIPID CLINIC CONSULT NOTE  Chief Complaint:  Manage dyslipidemia  Primary Care Physician: Theadore Nan, NP  Primary Cardiologist:  None  HPI:  Misty Osborne is a 62 y.o. female who is being seen today for the evaluation of dyslipidemia at the request of Shon Hale, *.  This a pleasant 62 year old female kindly referred by Dr. Chanetta Marshall for evaluation management of dyslipidemia.  She has a history of elevated cholesterol in the past but had no coronary calcium by CAC scoring in 2022.  More recent lipid testing showed total cholesterol 290, HDL 85, triglycerides 290 and LDL 208.  She was advised to start on rosuvastatin 5 mg daily.  She had previously taken atorvastatin in the past but had side effects including myalgias and feeling unwell.  Overall she said she tolerated the rosuvastatin but when the prescription ran out she did not have it refilled.  Subsequently she has moved to [place] and establish with a new primary provider.  She does report heart disease in her family including both parents.  Her elevated cholesterol is concerning for genetic dyslipidemia.  Diet is otherwise healthy and low in saturated fats.  PMHx:  Past Medical History:  Diagnosis Date   ADHD    Eating disorder    Hyperlipidemia    Osteopenia    DEXA    UTI (urinary tract infection)     No past surgical history on file.  FAMHx:  Family History  Problem Relation Age of Onset   Heart disease Mother    Heart attack Mother    Hyperlipidemia Father    Cancer Father    Hyperlipidemia Brother     SOCHx:   reports that she has never smoked. She has never used smokeless tobacco. She reports current alcohol use of about 2.0 standard drinks of alcohol per week. She reports that she does not use drugs.  ALLERGIES:  Allergies  Allergen Reactions   Lipitor [Atorvastatin]     fatigue    ROS: Pertinent items noted in HPI and remainder of comprehensive ROS otherwise  negative.  HOME MEDS: Current Outpatient Medications on File Prior to Visit  Medication Sig Dispense Refill   amphetamine-dextroamphetamine (ADDERALL) 10 MG tablet Take 1 tablet (10 mg total) by mouth daily with breakfast. 30 tablet 0   Multiple Vitamin (MULTIVITAMIN ADULT PO) multivitamin     propranolol (INDERAL) 20 MG tablet Take 20 mg by mouth daily as needed.  5   No current facility-administered medications on file prior to visit.    LABS/IMAGING: No results found for this or any previous visit (from the past 48 hour(s)). No results found.  LIPID PANEL:    Component Value Date/Time   CHOL 252 (H) 02/11/2020 1439   TRIG 91.0 02/11/2020 1439   HDL 66.40 02/11/2020 1439   CHOLHDL 4 02/11/2020 1439   VLDL 18.2 02/11/2020 1439   LDLCALC 168 (H) 02/11/2020 1439    WEIGHTS: Wt Readings from Last 3 Encounters:  01/13/23 145 lb (65.8 kg)  11/16/20 132 lb (59.9 kg)  02/11/20 138 lb (62.6 kg)    VITALS: BP 108/66   Pulse 78   Ht 5\' 4"  (1.626 m)   Wt 145 lb (65.8 kg)   SpO2 95%   BMI 24.89 kg/m   EXAM: Deferred  EKG: Deferred  ASSESSMENT: Possible familial hyperlipidemia or familial combined hyperlipidemia, LDL greater than 190 Family history of heart disease and high cholesterol 0 CAC score-2022  PLAN: 1.   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  She could not tolerate atorvastatin but did tolerate low-dose rosuvastatin.  I would like to restart that and I will provide a prescription today.  Will plan to repeat lipid NMR and LP(a) in about 3 months.  At that time we could consider additional therapies which I suspect she may need.  She is also agreeable to genetic testing today.  She understands that it is likely to be covered by insurance, but if not would incur a charge of no more than $299.  I will contact her with those results when they are available in a few weeks.  Plan follow-up after her lipids  Thanks again for the  kind referral.  Chrystie Nose, MD, [facility], FACP  [place]  [facility]  Medical Director of the Advanced Lipid Disorders &  Cardiovascular Risk Reduction Clinic Diplomate of the American Board of Clinical Lipidology Attending Cardiologist  Direct Dial: [phone]  Fax: [phone]  Website:  [email or web address] Nicely Olyvia Gopal 01/13/2023, 4:13 PM

## 2023-01-13 NOTE — Patient Instructions (Signed)
Medication Instructions:  RESUME Crestor 5mg  daily  *If you need a refill on your cardiac medications before your next appointment, please call your pharmacy*   Lab Work: FASTING lab work to check cholesterol in 3-4 months -- complete about 1 week before next visit  If you have labs (blood work) drawn today and your tests are completely normal, you will receive your results only by: MyChart Message (if you have MyChart) OR A paper copy in the mail If you have any lab test that is abnormal or we need to change your treatment, we will call you to review the results.   Testing/Procedures: Genetic Test -- results available in 2-3 weeks   Follow-Up: At [facility] you and your health needs are our priority.  As part of our continuing mission to provide you with exceptional heart care, we have created designated Provider Care Teams.  These Care Teams include your primary Cardiologist (physician) and Advanced Practice Providers (APPs -  Physician Assistants and Nurse Practitioners) who all work together to provide you with the care you need, when you need it.  We recommend signing up for the patient portal called "MyChart".  Sign up information is provided on this After Visit Summary.  MyChart is used to connect with patients for Virtual Visits (Telemedicine).  Patients are able to view lab/test results, encounter notes, upcoming appointments, etc.  Non-urgent messages can be sent to your provider as well.   To learn more about what you can do with MyChart, go to [email or web address].    Your next appointment:   3-4 months -- lipid clinic -- Hilty MD

## 2023-02-01 ENCOUNTER — Encounter (HOSPITAL_BASED_OUTPATIENT_CLINIC_OR_DEPARTMENT_OTHER): Payer: Self-pay | Admitting: Internal Medicine

## 2023-02-15 ENCOUNTER — Institutional Professional Consult (permissible substitution) (HOSPITAL_BASED_OUTPATIENT_CLINIC_OR_DEPARTMENT_OTHER): Payer: BC Managed Care – PPO | Admitting: Internal Medicine

## 2023-03-07 DIAGNOSIS — N950 Postmenopausal bleeding: Secondary | ICD-10-CM | POA: Diagnosis not present

## 2023-03-07 DIAGNOSIS — D251 Intramural leiomyoma of uterus: Secondary | ICD-10-CM | POA: Diagnosis not present

## 2023-03-29 ENCOUNTER — Ambulatory Visit: Payer: BC Managed Care – PPO | Admitting: Physician Assistant

## 2023-03-29 ENCOUNTER — Encounter: Payer: Self-pay | Admitting: Physician Assistant

## 2023-03-29 DIAGNOSIS — F4010 Social phobia, unspecified: Secondary | ICD-10-CM

## 2023-03-29 DIAGNOSIS — F900 Attention-deficit hyperactivity disorder, predominantly inattentive type: Secondary | ICD-10-CM

## 2023-03-29 MED ORDER — AMPHETAMINE-DEXTROAMPHETAMINE 10 MG PO TABS
ORAL_TABLET | ORAL | 0 refills | Status: DC
Start: 2023-05-11 — End: 2023-07-21

## 2023-03-29 MED ORDER — AMPHETAMINE-DEXTROAMPHETAMINE 10 MG PO TABS: ORAL_TABLET | ORAL | 0 refills | Status: DC

## 2023-03-29 MED ORDER — AMPHETAMINE-DEXTROAMPHETAMINE 10 MG PO TABS
ORAL_TABLET | ORAL | 0 refills | Status: DC
Start: 2023-06-10 — End: 2023-07-21

## 2023-03-29 NOTE — Progress Notes (Signed)
[facility]  Patient ID: Misty Osborne [place],  MRN: [id number]  PCP: Shon Hale, MD  Date of Evaluation: 03/29/2023 Time spent:20 minutes  Chief Complaint:  Chief Complaint   Anxiety; Follow-up; ADHD    HISTORY/CURRENT STATUS: HPI For routine six-month med check.  Misty Osborne is doing well. The Adderall still helps but sometimes doesn't think it works as well as it used to.  Or as long. She is hesitant to change doses or anything though.   Patient is able to enjoy things.  Energy and motivation are good.  Work is going well.   No extreme sadness, tearfulness, or feelings of hopelessness.  Sleeps well most of the time. ADLs and personal hygiene are normal. Memory is nl.  Appetite has not changed.  Weight is stable.   No Osborne/o anxiety. Denies suicidal or homicidal thoughts.  Patient denies increased energy with decreased need for sleep, increased talkativeness, racing thoughts, impulsivity or risky behaviors, increased spending, increased libido, grandiosity, increased irritability or anger, paranoia, or hallucinations.  Denies dizziness, syncope, seizures, numbness, tingling, tremor, tics, unsteady gait, slurred speech, confusion.  Has scoliosis and back pain off and on but otherwise no musculoskeletal abnormalities.  Individual Medical History/ Review of Systems: Changes? :No     Past medications for mental health diagnoses include: Prozac, Adderall, propranolol  Allergies: Lipitor [atorvastatin]  Current Medications:  Current Outpatient Medications:    [START ON 05/11/2023] amphetamine-dextroamphetamine (ADDERALL) 10 MG tablet, 1 po q am and 1/2 every day at lunch prn., Disp: 45 tablet, Rfl: 0   [START ON 06/10/2023] amphetamine-dextroamphetamine (ADDERALL) 10 MG tablet, 1 po q am, 1/2 po at lunch prn., Disp: 45 tablet, Rfl: 0   Multiple Vitamin (MULTIVITAMIN ADULT PO), multivitamin, Disp: , Rfl:    propranolol (INDERAL) 20 MG tablet, Take 20 mg by mouth daily as  needed., Disp: , Rfl: 5   rosuvastatin (CRESTOR) 5 MG tablet, Take 1 tablet (5 mg total) by mouth daily., Disp: 90 tablet, Rfl: 3   [START ON 04/11/2023] amphetamine-dextroamphetamine (ADDERALL) 10 MG tablet, 1 po q am and 1/2 po every day at lunch prn., Disp: 45 tablet, Rfl: 0 Medication Side Effects: none  Family Medical/ Social History: Changes?  No  MENTAL HEALTH EXAM:  There were no vitals taken for this visit.There is no height or weight on file to calculate BMI.  General Appearance: Casual, Neat and Well Groomed  Eye Contact:  Good  Speech:  Clear and Coherent and Normal Rate  Volume:  Normal  Mood:  Euthymic  Affect:  Congruent  Thought Process:  Goal Directed and Descriptions of Associations: Circumstantial  Orientation:  Full (Time, Place, and Person)  Thought Content: Logical   Suicidal Thoughts:  No  Homicidal Thoughts:  No  Memory:  WNL  Judgement:  Good  Insight:  Good  Psychomotor Activity:  Normal  Concentration:  Concentration: Fair and Attention Span: Good  Recall:  Good  Fund of Knowledge: Good  Language: Good  Assets:  Desire for Improvement Financial Resources/Insurance Housing Physical Health Transportation Vocational/Educational  ADL's:  Intact  Cognition: WNL  Prognosis:  Good   DIAGNOSES:    ICD-10-CM   1. Attention deficit hyperactivity disorder (ADHD), predominantly inattentive type  F90.0     2. Social anxiety disorder  F40.10      Receiving Psychotherapy: No   RECOMMENDATIONS:  PDMP was reviewed.  Last Adderall filled 03/14/2023.  I provided 20 minutes of face to face time during this encounter, including time spent  before and after the visit in records review, medical decision making, counseling pertinent to today's visit, and charting.   Recommend increasing the Adderall, to a total of 15 mg/day, cont the 10 mg in the morning (or 15 mg in morn) or add the 5 mg at lunch prn.   Increase Adderall 10 mg, as noted above.  Continue  propranolol 20 mg, 1 p.o. daily as needed anxiety, prescribed by PCP.  She rarely takes it. Return in 6 months.  [name], PA-Osborne

## 2023-05-27 DIAGNOSIS — E785 Hyperlipidemia, unspecified: Secondary | ICD-10-CM | POA: Diagnosis not present

## 2023-05-30 LAB — NMR, LIPOPROFILE
Cholesterol, Total: 205 mg/dL — ABNORMAL HIGH (ref 100–199)
HDL Particle Number: 39.4 umol/L (ref >=30.5)
HDL-C: 76 mg/dL (ref >39)
LDL Particle Number: 1129 nmol/L — ABNORMAL HIGH (ref <1000)
LDL Size: 21.6 nmol (ref >20.5)
LDL-C (NIH Calc): 120 mg/dL — ABNORMAL HIGH (ref 0–99)
LP-IR Score: 33 (ref <=45)
Small LDL Particle Number: 243 nmol/L (ref <=527)
Triglycerides: 50 mg/dL (ref 0–149)

## 2023-05-30 LAB — LIPOPROTEIN A (LPA): Lipoprotein (a): 12.1 nmol/L

## 2023-06-02 ENCOUNTER — Encounter (HOSPITAL_BASED_OUTPATIENT_CLINIC_OR_DEPARTMENT_OTHER): Payer: Self-pay

## 2023-06-07 ENCOUNTER — Ambulatory Visit (HOSPITAL_BASED_OUTPATIENT_CLINIC_OR_DEPARTMENT_OTHER): Payer: BC Managed Care – PPO | Admitting: Internal Medicine

## 2023-06-07 VITALS — BP 100/56 | HR 68 | Ht 64.0 in | Wt 143.0 lb

## 2023-06-07 DIAGNOSIS — E7800 Pure hypercholesterolemia, unspecified: Secondary | ICD-10-CM

## 2023-06-07 DIAGNOSIS — E785 Hyperlipidemia, unspecified: Secondary | ICD-10-CM

## 2023-06-07 MED ORDER — ROSUVASTATIN CALCIUM 20 MG PO TABS: 20.0000 mg | ORAL_TABLET | Freq: Every day | ORAL | 3 refills | Status: AC

## 2023-06-07 NOTE — Progress Notes (Signed)
 LIPID CLINIC CONSULT NOTE  Chief Complaint:  Manage dyslipidemia  Primary Care Physician: Shon Hale, MD  Primary Cardiologist:  None  HPI:  Misty Osborne is a 63 y.o. female who is being seen today for the evaluation of dyslipidemia at the request of Theadore Nan, NP.  This a pleasant 63 year old female kindly referred by Dr. Chanetta Marshall for evaluation management of dyslipidemia.  She has a history of elevated cholesterol in the past but had no coronary calcium by CAC scoring in 2022.  More recent lipid testing showed total cholesterol 290, HDL 85, triglycerides 290 and LDL 208.  She was advised to start on rosuvastatin 5 mg daily.  She had previously taken atorvastatin in the past but had side effects including myalgias and feeling unwell.  Overall she said she tolerated the rosuvastatin but when the prescription ran out she did not have it refilled.  Subsequently she has moved to [place] and establish with a new primary provider.  She does report heart disease in her family including both parents.  Her elevated cholesterol is concerning for genetic dyslipidemia.  Diet is otherwise healthy and low in saturated fats.  06/07/2023  Ms. Misty Osborne seen today in follow-up.  Her LP(a) was fortunately negative.  Her LDL has improved with an LDL particle number of 1129, LDL 120, HDL 76 and triglycerides 50.  Ideally target her LDL to less than 100 due to 0 coronary calcium.  Currently she is on 5 mg rosuvastatin and seems to be tolerating this well.  We discussed options including increasing her dose or adding an adjunctive therapy to help her reach her target.  PMHx:  Past Medical History:  Diagnosis Date   ADHD    Eating disorder    Hyperlipidemia    Osteopenia    DEXA    UTI (urinary tract infection)     No past surgical history on file.  FAMHx:  Family History  Problem Relation Age of Onset   Heart disease Mother    Heart attack Mother    Hyperlipidemia Father     Cancer Father    Hyperlipidemia Brother     SOCHx:   reports that she has never smoked. She has never used smokeless tobacco. She reports current alcohol use of about 2.0 standard drinks of alcohol per week. She reports that she does not use drugs.  ALLERGIES:  Allergies  Allergen Reactions   Lipitor [Atorvastatin]     fatigue    ROS: Pertinent items noted in HPI and remainder of comprehensive ROS otherwise negative.  HOME MEDS: Current Outpatient Medications on File Prior to Visit  Medication Sig Dispense Refill   amphetamine-dextroamphetamine (ADDERALL) 10 MG tablet 1 po q am and 1/2 po every day at lunch prn. 45 tablet 0   Multiple Vitamin (MULTIVITAMIN ADULT PO) multivitamin     propranolol (INDERAL) 20 MG tablet Take 20 mg by mouth daily as needed.  5   rosuvastatin (CRESTOR) 5 MG tablet Take 1 tablet (5 mg total) by mouth daily. 90 tablet 3   amphetamine-dextroamphetamine (ADDERALL) 10 MG tablet 1 po q am and 1/2 every day at lunch prn. 45 tablet 0   [START ON 06/10/2023] amphetamine-dextroamphetamine (ADDERALL) 10 MG tablet 1 po q am, 1/2 po at lunch prn. 45 tablet 0   No current facility-administered medications on file prior to visit.    LABS/IMAGING: No results found for this or any previous visit (from the past 48 hours). No results found.  LIPID  PANEL:    Component Value Date/Time   CHOL 252 (H) 02/11/2020 1439   TRIG 91.0 02/11/2020 1439   HDL 66.40 02/11/2020 1439   CHOLHDL 4 02/11/2020 1439   VLDL 18.2 02/11/2020 1439   LDLCALC 168 (H) 02/11/2020 1439    WEIGHTS: Wt Readings from Last 3 Encounters:  06/07/23 143 lb (64.9 kg)  01/13/23 145 lb (65.8 kg)  11/16/20 132 lb (59.9 kg)    VITALS: BP (!) 100/56 (BP Location: Left Arm, Patient Position: Sitting, Cuff Size: Normal)   Pulse 68   Ht 5\' 4"  (1.626 m)   Wt 143 lb (64.9 kg)   SpO2 98%   BMI 24.55 kg/m   EXAM: Deferred  EKG: Deferred  ASSESSMENT: Possible familial hyperlipidemia or  familial combined hyperlipidemia, LDL greater than 190, goal LDL less than 100 Family history of heart disease and high cholesterol 0 CAC score-2022  PLAN: 1.   Ms. Misty Osborne has had a good improvement in her lipids with LDL now at 120.  Her target LDL is less than 100.  She is only on 5 mg of rosuvastatin.  I do think she is tolerating it well unlike the atorvastatin.  I would advise increasing this to 20 mg daily.  This should give an appropriate reduction to get her LDL less than 100.  It would also not necessitate adding additional medication.  She was advised to monitor for possible side effects with the increased dose.  Otherwise we will plan repeat lipid NMR in about 3 to 4 months.  She can follow-up with Hoover Browns, NP at that time.  Thanks again for the kind referral.  Chrystie Nose, MD, [facility]  [place]  [facility]  Medical Director of the [facility]id [facility] &  Cardiovascular [facility] Reduction Clinic Diplomate of the American Board of Clinical Lipidology Attending Cardiologist  Direct Dial: [phone]  Fax: [phone]  Website:  [email or web address]  Misty Osborne 06/07/2023, 2:12 PM

## 2023-06-07 NOTE — Patient Instructions (Signed)
 Medication Instructions:  INCREASE rosuvastatin to 20mg  daily  Lab Work: FASTING lab work in 4 months to check cholesterol  Complete about ONE WEEK before next appointment   Follow-Up: At [facility], you and your health needs are our priority.  As part of our continuing mission to provide you with exceptional heart care, we have created designated Provider Care Teams.  These Care Teams include your primary Cardiologist (physician) and Advanced Practice Providers (APPs -  Physician Assistants and Nurse Practitioners) who all work together to provide you with the care you need, when you need it.  We recommend signing up for the patient portal called "MyChart".  Sign up information is provided on this After Visit Summary.  MyChart is used to connect with patients for Virtual Visits (Telemedicine).  Patients are able to view lab/test results, encounter notes, upcoming appointments, etc.  Non-urgent messages can be sent to your provider as well.   To learn more about what you can do with MyChart, go to [email or web address].    Your next appointment:   4 months with Misty Bridegroom, FNP (lipid clinic)

## 2023-07-13 ENCOUNTER — Telehealth: Payer: Self-pay | Admitting: Physician Assistant

## 2023-07-13 DIAGNOSIS — E782 Mixed hyperlipidemia: Secondary | ICD-10-CM | POA: Diagnosis not present

## 2023-07-13 DIAGNOSIS — L908 Other atrophic disorders of skin: Secondary | ICD-10-CM | POA: Diagnosis not present

## 2023-07-13 DIAGNOSIS — C44519 Basal cell carcinoma of skin of other part of trunk: Secondary | ICD-10-CM | POA: Diagnosis not present

## 2023-07-13 DIAGNOSIS — M25552 Pain in left hip: Secondary | ICD-10-CM | POA: Diagnosis not present

## 2023-07-13 DIAGNOSIS — L570 Actinic keratosis: Secondary | ICD-10-CM | POA: Diagnosis not present

## 2023-07-13 DIAGNOSIS — K439 Ventral hernia without obstruction or gangrene: Secondary | ICD-10-CM | POA: Diagnosis not present

## 2023-07-13 DIAGNOSIS — L249 Irritant contact dermatitis, unspecified cause: Secondary | ICD-10-CM | POA: Diagnosis not present

## 2023-07-13 DIAGNOSIS — Z1331 Encounter for screening for depression: Secondary | ICD-10-CM | POA: Diagnosis not present

## 2023-07-13 DIAGNOSIS — L720 Epidermal cyst: Secondary | ICD-10-CM | POA: Diagnosis not present

## 2023-07-13 DIAGNOSIS — D225 Melanocytic nevi of trunk: Secondary | ICD-10-CM | POA: Diagnosis not present

## 2023-07-13 DIAGNOSIS — M546 Pain in thoracic spine: Secondary | ICD-10-CM | POA: Diagnosis not present

## 2023-07-13 DIAGNOSIS — D485 Neoplasm of uncertain behavior of skin: Secondary | ICD-10-CM | POA: Diagnosis not present

## 2023-07-13 NOTE — Telephone Encounter (Signed)
 Pt called asking for 3 month supply of her adderall 10 mg. She wants the scripts to be sent to the [facility] on [address] in [place]

## 2023-07-14 NOTE — Telephone Encounter (Signed)
 LF 3/12, due 4/9

## 2023-07-21 ENCOUNTER — Other Ambulatory Visit: Payer: Self-pay

## 2023-07-21 MED ORDER — AMPHETAMINE-DEXTROAMPHETAMINE 10 MG PO TABS: ORAL_TABLET | ORAL | 0 refills | Status: DC

## 2023-07-21 NOTE — Telephone Encounter (Signed)
 Pended 3 RF of Adderall 10 mg, #45, to [facility].

## 2023-08-17 DIAGNOSIS — L570 Actinic keratosis: Secondary | ICD-10-CM | POA: Diagnosis not present

## 2023-08-17 DIAGNOSIS — C44519 Basal cell carcinoma of skin of other part of trunk: Secondary | ICD-10-CM | POA: Diagnosis not present

## 2023-09-27 ENCOUNTER — Encounter: Payer: Self-pay | Admitting: Physician Assistant

## 2023-09-27 ENCOUNTER — Ambulatory Visit: Payer: BC Managed Care – PPO | Admitting: Physician Assistant

## 2023-09-27 DIAGNOSIS — F900 Attention-deficit hyperactivity disorder, predominantly inattentive type: Secondary | ICD-10-CM

## 2023-09-27 DIAGNOSIS — F4010 Social phobia, unspecified: Secondary | ICD-10-CM | POA: Diagnosis not present

## 2023-09-27 MED ORDER — AMPHETAMINE-DEXTROAMPHETAMINE 10 MG PO TABS: ORAL_TABLET | ORAL | 0 refills | Status: DC

## 2023-09-27 NOTE — Progress Notes (Signed)
 [facility]  Patient ID: Misty Osborne,  MRN: [id number]  PCP: Ransom Byers, MD  Date of Evaluation: 09/27/2023 Time spent:20 minutes  Chief Complaint:  Chief Complaint   ADD; Follow-up    HISTORY/CURRENT STATUS: HPI For routine 6 month med check.  The Adderall is still helpful. States that attention is good without easy distractibility.  Able to focus on things and finish tasks to completion.  Doesn't always take the afternoon dose, b/c not needed.    Patient is able to enjoy things.  Energy and motivation are good.  Work is going well.  Thinking about retiring in a year or so.  No extreme sadness, tearfulness, or feelings of hopelessness.  Sleeps ok. ADLs and personal hygiene are normal.   Appetite has not changed.  She rarely has anxiety.  Denies suicidal or homicidal thoughts.  Denies dizziness, syncope, seizures, numbness, tingling, tremor, tics, unsteady gait, slurred speech, confusion. Denies muscle or joint pain, stiffness, or dystonia.  Individual Medical History/ Review of Systems: Changes? :No     Past medications for mental health diagnoses include: Prozac, Adderall, propranolol  Allergies: Lipitor [atorvastatin]  Current Medications:  Current Outpatient Medications:    ANGELIQ 0.5-1 MG tablet, Take 1 tablet by mouth daily., Disp: , Rfl:    Multiple Vitamin (MULTIVITAMIN ADULT PO), multivitamin, Disp: , Rfl:    propranolol (INDERAL) 20 MG tablet, Take 20 mg by mouth daily as needed., Disp: , Rfl: 5   rosuvastatin  (CRESTOR ) 20 MG tablet, Take 1 tablet (20 mg total) by mouth daily., Disp: 90 tablet, Rfl: 3   [START ON 09/30/2023] amphetamine -dextroamphetamine  (ADDERALL) 10 MG tablet, 1 po q am and 1/2 every day at lunch prn., Disp: 45 tablet, Rfl: 0   [START ON 10/29/2023] amphetamine -dextroamphetamine  (ADDERALL) 10 MG tablet, 1 po q am, 1/2 po at lunch prn., Disp: 45 tablet, Rfl: 0   [START ON 11/28/2023] amphetamine -dextroamphetamine  (ADDERALL)  10 MG tablet, 1 po q am and 1/2 po every day at lunch prn., Disp: 45 tablet, Rfl: 0 Medication Side Effects: none  Family Medical/ Social History: Changes?  No  MENTAL HEALTH EXAM:  There were no vitals taken for this visit.There is no height or weight on file to calculate BMI.  General Appearance: Casual, Neat and Well Groomed  Eye Contact:  Good  Speech:  Clear and Coherent and Normal Rate  Volume:  Normal  Mood:  Euthymic  Affect:  Congruent  Thought Process:  Goal Directed and Descriptions of Associations: Circumstantial  Orientation:  Full (Time, Place, and Person)  Thought Content: Logical   Suicidal Thoughts:  No  Homicidal Thoughts:  No  Memory:  WNL  Judgement:  Good  Insight:  Good  Psychomotor Activity:  Normal  Concentration:  Concentration: Good and Attention Span: Good  Recall:  Good  Fund of Knowledge: Good  Language: Good  Assets:  Desire for Improvement Financial Resources/Insurance Housing Physical Health Transportation Vocational/Educational  ADL's:  Intact  Cognition: WNL  Prognosis:  Good   DIAGNOSES:    ICD-10-CM   1. Attention deficit hyperactivity disorder (ADHD), predominantly inattentive type  F90.0     2. Social anxiety disorder  F40.10      Receiving Psychotherapy: No   RECOMMENDATIONS:  PDMP was reviewed.  Last Adderall filled 08/31/2023. I provided 20 minutes of face to face time during this encounter, including time spent before and after the visit in records review, medical decision making, counseling pertinent to today's visit, and charting.  She is doing well so no changes need to be made.  Continue Adderall 10 mg, 1 p.o. every morning and one half p.o. at lunch as needed. Continue propranolol 20 mg, 1 p.o. daily as needed anxiety, prescribed by PCP.  She rarely takes it. Return in 6 months.  Marvia Slocumb, PA-C

## 2023-10-06 ENCOUNTER — Encounter (HOSPITAL_BASED_OUTPATIENT_CLINIC_OR_DEPARTMENT_OTHER): Payer: BC Managed Care – PPO | Admitting: Nurse Practitioner

## 2023-10-12 DIAGNOSIS — L570 Actinic keratosis: Secondary | ICD-10-CM | POA: Diagnosis not present

## 2023-10-12 DIAGNOSIS — D485 Neoplasm of uncertain behavior of skin: Secondary | ICD-10-CM | POA: Diagnosis not present

## 2023-10-12 DIAGNOSIS — D692 Other nonthrombocytopenic purpura: Secondary | ICD-10-CM | POA: Diagnosis not present

## 2023-10-28 DIAGNOSIS — M8580 Other specified disorders of bone density and structure, unspecified site: Secondary | ICD-10-CM | POA: Diagnosis not present

## 2023-10-28 DIAGNOSIS — E782 Mixed hyperlipidemia: Secondary | ICD-10-CM | POA: Diagnosis not present

## 2023-10-28 DIAGNOSIS — Z0000 Encounter for general adult medical examination without abnormal findings: Secondary | ICD-10-CM | POA: Diagnosis not present

## 2023-10-28 DIAGNOSIS — Z1330 Encounter for screening examination for mental health and behavioral disorders, unspecified: Secondary | ICD-10-CM | POA: Diagnosis not present

## 2023-10-28 DIAGNOSIS — Z1231 Encounter for screening mammogram for malignant neoplasm of breast: Secondary | ICD-10-CM | POA: Diagnosis not present

## 2023-10-28 DIAGNOSIS — Z131 Encounter for screening for diabetes mellitus: Secondary | ICD-10-CM | POA: Diagnosis not present

## 2023-10-28 DIAGNOSIS — Z1159 Encounter for screening for other viral diseases: Secondary | ICD-10-CM | POA: Diagnosis not present

## 2023-10-28 DIAGNOSIS — Z1331 Encounter for screening for depression: Secondary | ICD-10-CM | POA: Diagnosis not present

## 2023-10-28 DIAGNOSIS — Z114 Encounter for screening for human immunodeficiency virus [HIV]: Secondary | ICD-10-CM | POA: Diagnosis not present

## 2023-10-28 DIAGNOSIS — Z130 Encounter for screening for diseases of the blood and blood-forming organs and certain disorders involving the immune mechanism: Secondary | ICD-10-CM | POA: Diagnosis not present

## 2023-10-28 DIAGNOSIS — M25552 Pain in left hip: Secondary | ICD-10-CM | POA: Diagnosis not present

## 2023-11-03 DIAGNOSIS — M545 Low back pain, unspecified: Secondary | ICD-10-CM | POA: Diagnosis not present

## 2023-11-03 DIAGNOSIS — M5136 Other intervertebral disc degeneration, lumbar region with discogenic back pain only: Secondary | ICD-10-CM | POA: Diagnosis not present

## 2023-11-03 DIAGNOSIS — M5134 Other intervertebral disc degeneration, thoracic region: Secondary | ICD-10-CM | POA: Diagnosis not present

## 2023-11-03 DIAGNOSIS — M5030 Other cervical disc degeneration, unspecified cervical region: Secondary | ICD-10-CM | POA: Diagnosis not present

## 2023-11-03 DIAGNOSIS — X58XXXA Exposure to other specified factors, initial encounter: Secondary | ICD-10-CM | POA: Diagnosis not present

## 2023-11-03 DIAGNOSIS — S76012A Strain of muscle, fascia and tendon of left hip, initial encounter: Secondary | ICD-10-CM | POA: Diagnosis not present

## 2023-11-03 DIAGNOSIS — M419 Scoliosis, unspecified: Secondary | ICD-10-CM | POA: Diagnosis not present

## 2023-11-03 DIAGNOSIS — M25552 Pain in left hip: Secondary | ICD-10-CM | POA: Diagnosis not present

## 2023-11-11 DIAGNOSIS — Z1331 Encounter for screening for depression: Secondary | ICD-10-CM | POA: Diagnosis not present

## 2023-11-11 DIAGNOSIS — Z01419 Encounter for gynecological examination (general) (routine) without abnormal findings: Secondary | ICD-10-CM | POA: Diagnosis not present

## 2023-11-11 DIAGNOSIS — Z79890 Hormone replacement therapy (postmenopausal): Secondary | ICD-10-CM | POA: Diagnosis not present

## 2023-11-16 DIAGNOSIS — L578 Other skin changes due to chronic exposure to nonionizing radiation: Secondary | ICD-10-CM | POA: Diagnosis not present

## 2023-11-16 DIAGNOSIS — L821 Other seborrheic keratosis: Secondary | ICD-10-CM | POA: Diagnosis not present

## 2023-12-07 DIAGNOSIS — H35371 Puckering of macula, right eye: Secondary | ICD-10-CM | POA: Diagnosis not present

## 2024-01-12 DIAGNOSIS — M25552 Pain in left hip: Secondary | ICD-10-CM | POA: Diagnosis not present

## 2024-01-23 DIAGNOSIS — H6123 Impacted cerumen, bilateral: Secondary | ICD-10-CM | POA: Diagnosis not present

## 2024-01-23 DIAGNOSIS — J358 Other chronic diseases of tonsils and adenoids: Secondary | ICD-10-CM | POA: Diagnosis not present

## 2024-01-23 DIAGNOSIS — R0683 Snoring: Secondary | ICD-10-CM | POA: Diagnosis not present

## 2024-02-06 ENCOUNTER — Telehealth: Payer: Self-pay | Admitting: Physician Assistant

## 2024-02-06 DIAGNOSIS — R399 Unspecified symptoms and signs involving the genitourinary system: Secondary | ICD-10-CM | POA: Diagnosis not present

## 2024-02-06 NOTE — Telephone Encounter (Signed)
 Pt called requesting next 3 Rx for Adderall 10 mg to [facility]. Apt 12/16

## 2024-02-06 NOTE — Telephone Encounter (Signed)
LF 10/4, due 11/1

## 2024-02-10 ENCOUNTER — Other Ambulatory Visit: Payer: Self-pay

## 2024-02-10 DIAGNOSIS — F900 Attention-deficit hyperactivity disorder, predominantly inattentive type: Secondary | ICD-10-CM

## 2024-02-10 MED ORDER — AMPHETAMINE-DEXTROAMPHETAMINE 10 MG PO TABS: ORAL_TABLET | ORAL | 0 refills | Status: DC

## 2024-02-10 NOTE — Telephone Encounter (Signed)
 Pended

## 2024-02-13 DIAGNOSIS — M25552 Pain in left hip: Secondary | ICD-10-CM | POA: Diagnosis not present

## 2024-03-11 ENCOUNTER — Other Ambulatory Visit: Payer: Self-pay | Admitting: Physician Assistant

## 2024-03-11 DIAGNOSIS — F900 Attention-deficit hyperactivity disorder, predominantly inattentive type: Secondary | ICD-10-CM

## 2024-03-15 ENCOUNTER — Telehealth: Payer: Self-pay | Admitting: Physician Assistant

## 2024-03-15 ENCOUNTER — Other Ambulatory Visit: Payer: Self-pay

## 2024-03-15 DIAGNOSIS — F900 Attention-deficit hyperactivity disorder, predominantly inattentive type: Secondary | ICD-10-CM

## 2024-03-15 NOTE — Telephone Encounter (Signed)
 [facility] called and said that the nov 11 th script odf adderall they only had 7 pills and they gave those to her. [facility]. So [facility] [place]

## 2024-03-15 NOTE — Telephone Encounter (Signed)
 Pended 30 day supply.

## 2024-03-16 MED ORDER — AMPHETAMINE-DEXTROAMPHETAMINE 10 MG PO TABS: ORAL_TABLET | ORAL | 0 refills | Status: DC

## 2024-03-27 ENCOUNTER — Ambulatory Visit: Admitting: Physician Assistant

## 2024-03-27 ENCOUNTER — Encounter: Payer: Self-pay | Admitting: Physician Assistant

## 2024-03-27 DIAGNOSIS — F900 Attention-deficit hyperactivity disorder, predominantly inattentive type: Secondary | ICD-10-CM | POA: Diagnosis not present

## 2024-03-27 MED ORDER — AMPHETAMINE-DEXTROAMPHETAMINE 10 MG PO TABS: ORAL_TABLET | ORAL | 0 refills | Status: AC

## 2024-03-27 NOTE — Progress Notes (Signed)
 [facility]  Patient ID: Odester C Routson,  MRN: [id number]  PCP: Chrystal Lamarr RAMAN, MD  Date of Evaluation: 03/27/2024 Time spent:20 minutes  Chief Complaint:  Chief Complaint   ADHD; Follow-up     HISTORY/CURRENT STATUS: HPI For routine 6 month med check.  The Adderall is still helpful. States that attention is good without easy distractibility.  Able to focus on things and finish tasks to completion.  Doesn't always take the afternoon dose, b/c not needed.  She has had trouble finding the Adderall again lately.  She will be retiring in September.  Looking forward to that. Energy and motivation are good.  No extreme sadness, tearfulness, or feelings of hopelessness.  Sleeps ok.  ADLs and personal hygiene are normal.   No change in memory.  Appetite has not changed.  Weight is stable.  No complaints of anxiety.  No mania, delirium, AH/VH.  No SI/HI.  Individual Medical History/ Review of Systems: Changes? :No     Past medications for mental health diagnoses include: Prozac, Adderall, propranolol  Allergies: Lipitor [atorvastatin]  Current Medications:  Current Outpatient Medications:    ANGELIQ 0.5-1 MG tablet, Take 1 tablet by mouth daily., Disp: , Rfl:    Multiple Vitamin (MULTIVITAMIN ADULT PO), multivitamin, Disp: , Rfl:    propranolol (INDERAL) 20 MG tablet, Take 20 mg by mouth daily as needed., Disp: , Rfl: 5   rosuvastatin  (CRESTOR ) 20 MG tablet, Take 1 tablet (20 mg total) by mouth daily., Disp: 90 tablet, Rfl: 3   amphetamine -dextroamphetamine  (ADDERALL) 10 MG tablet, 1 po q am and 1/2-1 po every day at lunch prn., Disp: 60 tablet, Rfl: 0   [START ON 04/25/2024] amphetamine -dextroamphetamine  (ADDERALL) 10 MG tablet, 1 po q am and 1/2-1 every day at lunch prn., Disp: 60 tablet, Rfl: 0   [START ON 05/24/2024] amphetamine -dextroamphetamine  (ADDERALL) 10 MG tablet, 1 po q am, 1/2-1 po at lunch prn., Disp: 60 tablet, Rfl: 0 Medication Side Effects:  none  Family Medical/ Social History: Changes?  No  MENTAL HEALTH EXAM:  There were no vitals taken for this visit.There is no height or weight on file to calculate BMI.  General Appearance: Casual, Neat and Well Groomed  Eye Contact:  Good  Speech:  Clear and Coherent and Normal Rate  Volume:  Normal  Mood:  Euthymic  Affect:  Congruent  Thought Process:  Goal Directed and Descriptions of Associations: Circumstantial  Orientation:  Full (Time, Place, and Person)  Thought Content: Logical   Suicidal Thoughts:  No  Homicidal Thoughts:  No  Memory:  WNL  Judgement:  Good  Insight:  Good  Psychomotor Activity:  Normal  Concentration:  Concentration: Good and Attention Span: Good  Recall:  Good  Fund of Knowledge: Good  Language: Good  Assets:  Communication Skills Desire for Improvement Financial Resources/Insurance Housing Physical Health Transportation Vocational/Educational  ADL's:  Intact  Cognition: WNL  Prognosis:  Good   DIAGNOSES:    ICD-10-CM   1. Attention deficit hyperactivity disorder (ADHD), predominantly inattentive type  F90.0 amphetamine -dextroamphetamine  (ADDERALL) 10 MG tablet    amphetamine -dextroamphetamine  (ADDERALL) 10 MG tablet      Receiving Psychotherapy: No   RECOMMENDATIONS:  PDMP was reviewed.  Last Adderall filled 03/16/2024. I provided approximately 20 minutes of face to face time during this encounter, including time spent before and after the visit in records review, medical decision making, counseling pertinent to today's visit, and charting.   I am increasing the quantity of Adderall and  changing the directions so she will be able to have more on hand if the shortage continues.  I have no concerns of abuse or diversion, and she will not take the extra tablet unless needed.  She understands and agrees.  She prefers printed prescriptions.  I am having issues with printing from my computer so 3 separate prescriptions were handwritten and  given to her.  Continue Adderall 10 mg, 1 p.o. every morning and 1/2-1 p.o. at lunch as needed.   Continue propranolol 20 mg, 1 p.o. daily as needed anxiety, prescribed by PCP.  She rarely takes it. Return in 6 months.  Verneita Cooks, PA-C

## 2024-09-25 ENCOUNTER — Ambulatory Visit: Admitting: Physician Assistant
